# Patient Record
Sex: Male | Born: 1950
Health system: Southern US, Community
[De-identification: ages and names within clinical notes are randomized; demographics above are authoritative.]

## PROBLEM LIST (undated history)

## (undated) DIAGNOSIS — I2510 Atherosclerotic heart disease of native coronary artery without angina pectoris: Secondary | ICD-10-CM

## (undated) DIAGNOSIS — Z789 Other specified health status: Secondary | ICD-10-CM

## (undated) DIAGNOSIS — R37 Sexual dysfunction, unspecified: Secondary | ICD-10-CM

## (undated) DIAGNOSIS — I428 Other cardiomyopathies: Secondary | ICD-10-CM

## (undated) DIAGNOSIS — E785 Hyperlipidemia, unspecified: Secondary | ICD-10-CM

## (undated) DIAGNOSIS — I10 Essential (primary) hypertension: Secondary | ICD-10-CM

## (undated) HISTORY — PX: CORONARY ANGIOPLASTY WITH STENT PLACEMENT: SHX49

## (undated) HISTORY — DX: Other cardiomyopathies: I42.8

## (undated) HISTORY — PX: EXPLORATION POST OPERATIVE OPEN HEART: SHX5061

## (undated) HISTORY — DX: Sexual dysfunction, unspecified: R37

## (undated) HISTORY — DX: Hyperlipidemia, unspecified: E78.5

---

## 1999-11-21 HISTORY — PX: CARDIAC CATHETERIZATION: SHX172

## 2001-02-12 ENCOUNTER — Emergency Department (HOSPITAL_COMMUNITY): Admission: EM | Admit: 2001-02-12 | Discharge: 2001-02-12 | Payer: Self-pay | Admitting: Emergency Medicine

## 2001-02-13 ENCOUNTER — Encounter: Payer: Self-pay | Admitting: Emergency Medicine

## 2001-05-23 ENCOUNTER — Emergency Department (HOSPITAL_COMMUNITY): Admission: EM | Admit: 2001-05-23 | Discharge: 2001-05-23 | Payer: Self-pay | Admitting: Emergency Medicine

## 2001-11-20 DIAGNOSIS — I2510 Atherosclerotic heart disease of native coronary artery without angina pectoris: Secondary | ICD-10-CM | POA: Insufficient documentation

## 2001-11-20 DIAGNOSIS — I351 Nonrheumatic aortic (valve) insufficiency: Secondary | ICD-10-CM

## 2001-11-20 HISTORY — DX: Atherosclerotic heart disease of native coronary artery without angina pectoris: I25.10

## 2001-11-20 HISTORY — DX: Nonrheumatic aortic (valve) insufficiency: I35.1

## 2002-02-11 ENCOUNTER — Encounter: Payer: Self-pay | Admitting: Emergency Medicine

## 2002-02-11 ENCOUNTER — Emergency Department (HOSPITAL_COMMUNITY): Admission: EM | Admit: 2002-02-11 | Discharge: 2002-02-11 | Payer: Self-pay | Admitting: Emergency Medicine

## 2002-02-15 ENCOUNTER — Encounter: Payer: Self-pay | Admitting: *Deleted

## 2002-02-15 ENCOUNTER — Encounter (INDEPENDENT_AMBULATORY_CARE_PROVIDER_SITE_OTHER): Payer: Self-pay | Admitting: *Deleted

## 2002-02-15 ENCOUNTER — Inpatient Hospital Stay (HOSPITAL_COMMUNITY): Admission: EM | Admit: 2002-02-15 | Discharge: 2002-03-01 | Payer: Self-pay | Admitting: *Deleted

## 2002-02-15 ENCOUNTER — Encounter: Payer: Self-pay | Admitting: Emergency Medicine

## 2002-02-17 ENCOUNTER — Encounter: Payer: Self-pay | Admitting: *Deleted

## 2002-02-21 ENCOUNTER — Encounter: Payer: Self-pay | Admitting: Cardiothoracic Surgery

## 2002-02-22 ENCOUNTER — Encounter: Payer: Self-pay | Admitting: Cardiothoracic Surgery

## 2002-02-23 ENCOUNTER — Encounter: Payer: Self-pay | Admitting: Surgery

## 2002-02-24 ENCOUNTER — Encounter: Payer: Self-pay | Admitting: Cardiothoracic Surgery

## 2002-02-25 ENCOUNTER — Encounter: Payer: Self-pay | Admitting: Cardiothoracic Surgery

## 2002-03-27 ENCOUNTER — Ambulatory Visit (HOSPITAL_COMMUNITY): Admission: RE | Admit: 2002-03-27 | Discharge: 2002-03-27 | Payer: Self-pay | Admitting: Cardiovascular Disease

## 2002-03-27 ENCOUNTER — Encounter: Payer: Self-pay | Admitting: Cardiovascular Disease

## 2003-05-29 ENCOUNTER — Inpatient Hospital Stay (HOSPITAL_COMMUNITY): Admission: EM | Admit: 2003-05-29 | Discharge: 2003-06-03 | Payer: Self-pay | Admitting: Emergency Medicine

## 2003-06-18 ENCOUNTER — Ambulatory Visit (HOSPITAL_COMMUNITY): Admission: RE | Admit: 2003-06-18 | Discharge: 2003-06-18 | Payer: Self-pay | Admitting: *Deleted

## 2003-06-18 ENCOUNTER — Encounter: Payer: Self-pay | Admitting: Ophthalmology

## 2003-12-24 ENCOUNTER — Emergency Department (HOSPITAL_COMMUNITY): Admission: EM | Admit: 2003-12-24 | Discharge: 2003-12-24 | Payer: Self-pay | Admitting: Emergency Medicine

## 2004-08-04 ENCOUNTER — Emergency Department (HOSPITAL_COMMUNITY): Admission: EM | Admit: 2004-08-04 | Discharge: 2004-08-04 | Payer: Self-pay | Admitting: *Deleted

## 2004-09-29 ENCOUNTER — Ambulatory Visit (HOSPITAL_COMMUNITY): Admission: RE | Admit: 2004-09-29 | Discharge: 2004-09-29 | Payer: Self-pay | Admitting: Cardiovascular Disease

## 2004-11-20 HISTORY — PX: CIRCUMCISION: SUR203

## 2005-04-14 ENCOUNTER — Emergency Department (HOSPITAL_COMMUNITY): Admission: EM | Admit: 2005-04-14 | Discharge: 2005-04-14 | Payer: Self-pay | Admitting: Emergency Medicine

## 2005-05-26 ENCOUNTER — Inpatient Hospital Stay (HOSPITAL_COMMUNITY): Admission: RE | Admit: 2005-05-26 | Discharge: 2005-06-04 | Payer: Self-pay | Admitting: Urology

## 2005-05-26 ENCOUNTER — Encounter (INDEPENDENT_AMBULATORY_CARE_PROVIDER_SITE_OTHER): Payer: Self-pay | Admitting: Urology

## 2005-06-01 ENCOUNTER — Ambulatory Visit: Payer: Self-pay | Admitting: Orthopedic Surgery

## 2005-06-12 ENCOUNTER — Emergency Department (HOSPITAL_COMMUNITY): Admission: EM | Admit: 2005-06-12 | Discharge: 2005-06-12 | Payer: Self-pay | Admitting: Emergency Medicine

## 2005-06-14 ENCOUNTER — Emergency Department (HOSPITAL_COMMUNITY): Admission: EM | Admit: 2005-06-14 | Discharge: 2005-06-14 | Payer: Self-pay | Admitting: Emergency Medicine

## 2005-06-26 ENCOUNTER — Ambulatory Visit: Payer: Self-pay | Admitting: Orthopedic Surgery

## 2006-07-26 ENCOUNTER — Emergency Department (HOSPITAL_COMMUNITY): Admission: EM | Admit: 2006-07-26 | Discharge: 2006-07-26 | Payer: Self-pay | Admitting: Emergency Medicine

## 2009-07-04 ENCOUNTER — Emergency Department (HOSPITAL_COMMUNITY): Admission: EM | Admit: 2009-07-04 | Discharge: 2009-07-04 | Payer: Self-pay | Admitting: Emergency Medicine

## 2009-07-08 ENCOUNTER — Ambulatory Visit (HOSPITAL_COMMUNITY): Admission: RE | Admit: 2009-07-08 | Discharge: 2009-07-08 | Payer: Self-pay | Admitting: Emergency Medicine

## 2011-02-25 LAB — URINALYSIS, ROUTINE W REFLEX MICROSCOPIC
Ketones, ur: NEGATIVE mg/dL
Leukocytes, UA: NEGATIVE
Protein, ur: NEGATIVE mg/dL

## 2011-02-25 LAB — DIFFERENTIAL
Basophils Absolute: 0 10*3/uL (ref 0.0–0.1)
Eosinophils Relative: 4 % (ref 0–5)
Lymphocytes Relative: 20 % (ref 12–46)
Monocytes Absolute: 0.7 10*3/uL (ref 0.1–1.0)
Neutrophils Relative %: 65 % (ref 43–77)

## 2011-02-25 LAB — CBC
HCT: 37.5 % — ABNORMAL LOW (ref 39.0–52.0)
MCV: 74.8 fL — ABNORMAL LOW (ref 78.0–100.0)
WBC: 6.1 10*3/uL (ref 4.0–10.5)

## 2011-02-25 LAB — BASIC METABOLIC PANEL
BUN: 15 mg/dL (ref 6–23)
CO2: 22 mEq/L (ref 19–32)
GFR calc non Af Amer: >60 mL/min (ref >60)
Sodium: 135 mEq/L (ref 135–145)

## 2011-02-25 LAB — URINE MICROSCOPIC-ADD ON

## 2011-02-25 LAB — POCT CARDIAC MARKERS
CKMB, poc: 1.3 ng/mL (ref 1.0–8.0)
Myoglobin, poc: 77.1 ng/mL (ref 12–200)
Troponin i, poc: <0.05 ng/mL (ref 0.00–0.09)

## 2011-02-25 LAB — BRAIN NATRIURETIC PEPTIDE: Pro B Natriuretic peptide (BNP): <30 pg/mL (ref 0.0–100.0)

## 2011-02-25 LAB — PROTIME-INR: INR: 2.7 — ABNORMAL HIGH (ref 0.00–1.49)

## 2011-04-07 NOTE — Procedures (Signed)
[facility]  Patient:    [name], [name] Visit Number: [id number] MRN: [id number]          Service Type: EMS Location: ED Attending Physician:  Rosalyn Charters Dictated by:   Kari Baars, M.D. Proc. Date: 02/15/02 Admit Date:  02/15/2002 Discharge Date: 02/15/2002                            EKG Interpretations  The rhythm is a sinus with a rate in the 70s.  The is left ventricular hypertrophy.  There is possible intraventricular conduction delay and this may be related to the LVH as well.  There is ST elevation anteriorly which could be related to LVH.  Abnormal electrocardiogram. Dictated by:   Kari Baars, M.D. Attending Physician:  Rosalyn Charters DD:  02/19/02 TD:  02/20/02 Job: [id number]

## 2011-04-07 NOTE — Op Note (Signed)
[place]. [facility] 

## 2011-04-07 NOTE — Discharge Summary (Signed)
[place]  Patient:    [name], [name] Visit Number: [id number] MRN: [id number]          Service Type: MED Location: 2000 2040 01 Attending Physician:  Waldo Laine Dictated by:   Maxwell Marion, RNFA Admit Date:  02/15/2002 Disc. Date: 03/01/02   CC:         [name], M.D.  [facility] Department   Discharge Summary  DATE OF BIRTH:  Dec 24, 1950  ADMITTING DIAGNOSES: 1. Acute congestive heart failure. 2. Valvular dysfunction. 3. Uncontrolled hypertension.  PAST MEDICAL HISTORY: 1. Aortic valve insufficiency, evaluated by Dr. Alanda [name] in February 2002,    with a 2D echocardiogram. 2. History of syphilis treated seven years ago and again three weeks ago with    IM penicillin.  ALLERGIES:  No known drug allergies.  DISCHARGE DIAGNOSIS:  Aortic dilatation, status post aortic valve and aortic root [name].  HISTORY OF PRESENT ILLNESS:  [name] is a 60 year old, African-American male.  On March 24, he experienced sudden onset of band-like abdominal pain accompanied by shortness of breath.  He was evaluated at the hospital on March 25, and given the medication for acid reflux.  On March 28, he was awakened at 3 a.m. with severe shortness of breath.  This progressively worsened.  He eventually went to the emergency room at [facility].  He experienced chest tightness and soreness with this episode.  EKG revealed a left ventricular hypertrophy with ST abnormalities.  Chest x-ray revealed massive CM and BNP was positive.  It was recommended that he be transferred to [facility] for further evaluation and treatment.  HOSPITAL COURSE:  On March 29, [name] was admitted to [facility] in the care of Dr. Domingo Sep and the [facility] and Vascular Center. After evaluation, Dr. Domingo Sep recommended CT scan of the chest.  This revealed a thoracic aortic aneurysm measuring  approximately 5 cm and LV hypertrophy.  He underwent cardiac catheterization on March 31, which revealed dilated aortic root, ejection fraction 25%, 3-4+ aortic valve insufficiency. No coronary artery disease was detected.  Doppler studies done that day revealed no significant carotid artery disease.  His ABIs were noted to be greater than 1.0 bilaterally.  Also on March 31, cardiac surgery consult was obtained with Dr. Sheliah Plane.  After examination of the patient and review of the records and CT scans, Dr. Ysidro Evert recommendation was that [name].  He did recommend dental consult prior to surgery.  On April 1, Dr. Kristin Bruins evaluated [name] and recommend tooth extraction and alveoplasty.  This was completed on April 2, with no complications and the patient tolerated it very well.  On April 4, [name] underwent a Bentall procedure (aortic valve [name] and aortic root [name]) with a 27 mm St. Jude valve and conduit prosthesis.  He tolerated this procedure well and was transferred in stable condition to the SICU.  Also on April 4, ID consult was obtained regarding positive RPR titer.  They recommended evaluation of the surgical specimen for syphilitic aortitis also to initiate treatment with penicillin 2.4 IM each week x3 doses.  Also recommended was further STD testing.  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  The remainder of his STD workup was negative.  [name] has remained hemodynamically stable throughout the postoperative period.  He did have some mild elevation in his  creatinine measuring 1.5 on April 7.  Coumadin anticoagulant therapy was initiated on April 7.  His PT/INR did not immediate rise so Lovenox was also initiated on April 9. Postoperatively, he did experience anemia requiring transfusion on April 9. On that day, his hemoglobin was 7.4,  hematocrit 21.9.  After transfusion, his H&H improved to 8.1 and 24.1 on April 10.  On April 8, cardiology consult was obtained regarding an episode of wide complex tachycardia.  Electrophysiology consult was done by Dr. Ladona Ridgel.  After examination of the patient and reviewing all his records, he recommended medical treatment with an ACE inhibitor and beta-blocker.  [name] is making very good progress after his surgery.  On the morning of April 12, he is afebrile.  His laboratory studies are stable.  His Coumadin is therapeutic at 2.1.  His wounds are healing well and he has tolerated a regular diet and was ambulating well.  He did have a short run of SVT early in the morning and he was asymptomatic.  He was observed for several more hours without recurrence and was able to be discharged home with instructions to call his cardiologist if this recurs and is persistent.  LABORATORY DATA AND X-RAY FINDINGS:  On April 12, white blood cells 13.7, hemoglobin 8.8, hematocrit 26.1, platelets 543.  On April 11, chemistries were sodium 134, potassium 3.8, chloride 100, CO2 27, BUN 28, creatinine 1.2, glucose 111.  On April 12, PT 20.5, INR 2.1.  Digoxin level less than 0.1.  CONDITION ON DISCHARGE:  Improved.  ACTIVITY:  He has been instructed to do no driving and no lifting, pushing or pulling of anything more than 10 pounds.  He has also been instructed to continue his breathing exercises and daily walks.  DIET:  Low fat, low salt diet.  WOUND CARE:  He may shower at home.  He has been instructed to clean his wounds with soap and water and call the office if wounds or problems arise as described in the cardiac surgery fact sheet.  DISCHARGE MEDICATIONS: 1. Tylox one to two p.o. q.4-6h. p.r.n. pain. 2. Coreg 3.125 mg p.o. q.12h. 3. Altace 2.5 mg p.o. q.d. 4. Lanoxin 0.125 mg p.o. q.d. 5. Coumadin 7.5 mg p.o. q.d. or as instructed. 6. Niferex 150 mg p.o. q.d.  7. Folic acid 1 mg p.o.  q.d.  FOLLOWUP:  He has been instructed to have his blood drawn at [facility] on Monday, April 14, for a PT/INR level.  The results will be faxed to the [facility] and [facility] Coumadin Clinic and the patient will be contacted regarding his Coumadin dose.  He has been instructed to follow up with the [facility] Department next Friday, April 18, for his last penicillin injection.  This injection will complete his full course of treatment for syphilis.  He has been instructed to call Dr. Mancel Parsons office and arrange an appointment in approximately two weeks. He has an appointment at the CVTS office to see Dr. Tyrone Sage on Thursday, May 1, at 10:40 a.m.  He has been asked to get a chest x-ray at the [facility] at 9:40 a.m. that morning. Dictated by:   Maxwell Marion, RNFA Attending Physician:  Waldo Laine DD:  03/01/02 TD:  03/03/02 Job: [phone] [id number]

## 2011-04-07 NOTE — Discharge Summary (Signed)
NAME:  [name], [name]                           ACCOUNT NO.:  [id number]   MEDICAL RECORD NO.:  [id number]                   PATIENT TYPE:  INP   LOCATION:  4728                                 FACILITY:  [facility]   PHYSICIAN:  [name] A. Alanda Amass, M.D.          DATE OF BIRTH:  [birth year]   DATE OF ADMISSION:  05/29/2003  DATE OF DISCHARGE:  06/03/2003                                 DISCHARGE SUMMARY   DISCHARGE DIAGNOSES:  1. Nontherapeutic INR with mechanical aortic valve replacement.  2. Hypertension.  3. Gastroesophageal reflux disease.  4. Long-term anticoagulation.   CONDITION ON DISCHARGE:  Improved.   DISCHARGE MEDICATIONS:  1. Coreg 12.5 mg 1 twice a day.  2. Altace 10 mg daily.  3. Lanoxin 0.125 mg daily.  4. Folic acid 1 mg daily.  5. Triamterene/HCTZ 37.5/25 daily.  6. Coumadin 10 mg daily except 12.5 Tuesday and Thursday, 2 tablets of 5 mg     daily except 2-1/2 tablets on Tuesday and Thursday.   DISCHARGE INSTRUCTIONS:  1. Activity as tolerated.  2. A 2 g sodium diet.  3. Have blood work done on Friday of this week.  4. Follow up with Dr. Alanda Amass as previously instructed in three months.   HISTORY OF PRESENT ILLNESS:  A 60 year old African-American male with a  history of reconstruction of aortic root and repair of ascending aorta with  a Bentall #25 St. Jude aortic valve conduit on chronic Coumadin, with a  longstanding history of noncompliance of Coumadin and pro time evaluations.   His prior pro time was on May 01, 2003, and he was 1.7.  He was instructed  to recheck frequently.  No recheck until May 29, 2003, and his INR was 1.2.  The patient was called and asked to come to ER for medication with IV  heparin, Coumadin reload.  He stated he went to [place] and ran out of  Coumadin for the three days prior to admission.   PAST MEDICAL HISTORY:  1. Aortic valve replacement with Bentall reconstruction April 2003 secondary     to severe AI and aortic  root aneurysm.  2. Chronic anticoagulation on Coumadin.  3. Hypertension.  4. LV dysfunction, EF 40 to 45%.  5. Nonischemic cardiomyopathy.  6. History of RPR.  7. Gastroesophageal reflux disease.   OUTPATIENT MEDICATIONS:  1. Coreg 12.5 twice a day.  2. Altace 10 daily.  3. Digitek 125 mcg daily.  4. Coumadin 7.5 mg daily, but he has been subtherapeutic for one year.  5. Folic acid 1 mg daily.  6. Triamterene/HCTZ 37.5/25 daily.   ALLERGIES:  VICODIN.   For Family History, Social History, Review of Systems, see H&P.   PHYSICAL EXAMINATION AT DISCHARGE:  VITAL SIGNS:  Blood pressure 146/88,  pulse 62, respirations 20, temperature 98.  Room air oxygen saturation 100%.  Sinus rhythm.  LUNGS:  Clear to auscultation bilaterally.  HEART:  Regular  rate and rhythm.   LABORATORY DATA:  1. Hemoglobin on admission 12.1, hematocrit 36.3, WBC 7.4, platelets 207.     This remained stable.  MCV slightly low at 76.5.  2. Pro time 14.6, INR 1.2 on heparin and Coumadin extra load.  Prior to     discharge, he was 2.2 INR and pro time 20.3.  3. Sodium 138, potassium 3.6, chloride 106, CO2 26, glucose 118, BUN 20,     creatinine 1.1, calcium 9.9. Digoxin 0.2.  4. EKG on admission: Sinus rhythm, left ventricular hypertrophy with QRS     finding and repolarization abnormality.  Rate slower than on previous     tracing.   I did not have a chest x-ray report in the chart.   HOSPITAL COURSE:  Mr. Knoedler was admitted by Dr. Alanda Amass on May 29, 2003,  for inadequate, nontherapeutic INRs.  The patient actually stopped his  Coumadin three days prior to admission because he ran out.  He has  prosthetic valve conduit.   He was placed on heparin, and Coumadin was restarted.  The patient remained  in the hospital.  He was stable except INR was not therapeutic, and by June 03, 2003, his INR was 2.2 and was sent home with higher dose of Coumadin.  Will have it rechecked on Friday.           Darcella Gasman. [place], N.P.                     [name] A. Alanda Amass, M.D.   LRI/MEDQ  D:  07/07/2003  T:  07/08/2003  Job:  [id number]   cc:   [facility] Department

## 2011-04-07 NOTE — Consult Note (Signed)
NAME:  [name] [name]               ACCOUNT NO.:  [id number]   MEDICAL RECORD NO.:  [id number]          PATIENT TYPE:  INP   LOCATION:  A311                          FACILITY:  [facility]   PHYSICIAN:  Lonia Blood, M.D.      DATE OF BIRTH:  1951-11-02   DATE OF CONSULTATION:  05/26/2005  DATE OF DISCHARGE:                                   CONSULTATION   REASON FOR CONSULTATION:  Chronic anticoagulation.   CONSULT REQUESTED FOR:  Dr. Dennie Maizes, urology.   HISTORY OF PRESENT ILLNESS:  The patient is a 60 year old African-American  man with history of aortic valve replacement with St. Jude's valve, who has  been on chronic anticoagulation.  The patient was being followed by Dr.  Sheffield Slider of [facility].  He has had recent  recurrent balanitis and phimosis that necessitated surgery.  He had  circumcision done today.  Prior to his surgery, the patient's Coumadin was  held to raise his INR.  His INR has since dropped to 1.2.  His INR was  supposed to be in the range between 2.5 and 3.5.  Now postoperatively, he  will need to have his anticoagulation resumed.  This necessitated this  consult.   PAST MEDICAL HISTORY:  Significant for mainly hypertension.  Also aortic  valve disease, status post aortic valve replacement.  He has also had some  LVH from his disease with secondary ST-T wave changes, but that is chronic,  with some poor R wave progression.  The patient also has a history of GERD.   ALLERGIES:  No known drug allergies.   SOCIAL HISTORY:  The patient lives in [place].  He lives alone.  Apparently he is single.  Denied any tobacco or alcohol use.   FAMILY HISTORY:  Significant for history of diabetes and heart disease on  both sides of his family.   CURRENT MEDICATIONS:  1.  Folic acid 1 mg daily.  2.  Nexium 40 mg daily.  3.  Norvasc 5 mg daily.  4.  Coreg 25 mg p.o. b.i.d.  5.  Coumadin 5 mg daily.  6.  Altace 10 mg p.o. daily.  7.   Digoxin 0.125 mg daily.  8.  Triamterene/hydrochlorothiazide 37.5/25 one p.o. Monday, Wednesday and      Friday.   REVIEW OF SYSTEMS:  Mainly pain at the site of surgery, but otherwise 10  point review of systems essentially negative.   PHYSICAL EXAMINATION:  VITAL SIGNS:  Temperature 97.6, blood pressure  113/89, pulse 56, respiratory rate 20.  Saturations 96% on 2 liters.  GENERAL:  The patient is awake, alert, oriented, slightly drowsy  postoperatively, but he is in no acute distress.  HEENT:  PERRL/EOMI.  NECK:  Supple.  No JVD, no lymphadenopathy.  RESPIRATORY:  He has good air entry bilaterally.  No wheezes or rhonchi.  CARDIOVASCULAR:  The patient has regular rate and rhythm with permanent  systolic click.  ABDOMEN:  Obese, nontender, with positive bowel sounds.  EXTREMITIES:  Show no edema, cyanosis or clubbing.   LABORATORY DATA:  Labs today showed  his PT-INR to be 15.3 and 1.2,  respectively.  His PTT was 34.   ASSESSMENT:  This is a 60 year old gentleman admitted with phimosis,  balanitis, status post circumcision.  The patient has St. Jude's valve that  requires chronic anticoagulation.  Target INR should be between 2.5 and 3.5.  He is currently subtherapeutic with his INR.   PLAN:  1.  Anticoagulation.  Will recommend starting heparin gtt,  right now with      no bolus.  Will also started Coumadin later tonight and continue to      monitor his PT-INR.  Once his INR is therapeutic in the range of 2.5 to      3.5, the heparin can be discontinued.  2.  Hypertension.  Recommend continue with his home medications for his      blood pressure control.  3.  Of note, I have discussed with Dr. Allyson Sabal who is on-call for Dr.      Linford Arnold  today.  Dr. Allyson Sabal agrees with this plan and is okay with [place]      continuing with patient's care as indicated here.  I will therefore      continue by asking pharmacy to start heparin right away and then the      Coumadin  thereafter.       LG/MEDQ  D:  05/26/2005  T:  05/26/2005  Job:  [id number]

## 2011-04-07 NOTE — Consult Note (Signed)
[place]  Patient:    [name], [name] Visit Number: [id number] MRN: [id number]          Service Type: MED Location: 2000 2040 01 Attending Physician:  Waldo Laine Dictated by:   Doylene Canning. Ladona Ridgel, M.D. [facility] Proc. Date: 02/26/02 Admit Date:  02/15/2002   CC:         Lennette Bihari, M.D.  [name] A. Alanda Amass, M.D.  Kathrine Cords  [facility]   Consultation Report  ELECTROPHYSIOLOGIC STUDY  INDICATION FOR CONSULTATION:  Evaluation of wide QRS tachycardia in the setting of aortic regurgitation, nonischemic cardiomyopathy status post aortic valve and ascending aortic replacement.  HISTORY OF PRESENT ILLNESS:  The patient is a very pleasant 60 year old man with a long history of a "murmur" who is known to have aortic insufficiency over a year ago. Several weeks ago he developed congestive heart failure symptoms and subsequently presented and was found to have severe LV systolic dysfunction and severe aortic insufficiency. He had no obstructive coronary disease. His ejection fraction was approximately 20%. The patient subsequently underwent aortic valve replacement with ascending aorta replacement with a Dacron patch. His postoperative course has been notable for some congestive heart failure as well as a 25 beat run of nonsustained ventricular tachycardia at a cycle length of 380 msec. This was associated with shortness of breath and lightheadedness but no frank syncope. The tachycardia terminated spontaneously. The patient denies any history of syncope or near syncope. He denies any family history of sudden cardiac death. There is no history of Marfan syndrome.  PAST MEDICAL HISTORY:  Notable for history of congestive heart failure. He also has a history of gastroesophageal reflux disease, he has a history of untreated hypertension which most likely is the etiology behind his aortic insufficiency, and he has a history of  syphilis status post multiple treatments.  CURRENT MEDICATIONS:  1. Coreg 3.125 mg twice a day.  2. Altace 2.5 mg a day.  3. Digoxin 0.125 mg a day.  4. Folate 1 mg a day.  5. Penicillin.  6. Pepcid 20 mg a day.  7. Aspirin 81 mg a day.  8. Coumadin.  9. Lasix 40 mg a day. 10. Lovenox.  SOCIAL HISTORY:  The patient lives in [place] with his girlfriend. He works at [employer]. He denies tobacco or ethanol abuse.  FAMILY HISTORY:  Notable for a mother who is alive at age 67 with diabetes and heart disease. He has a father who is also alive at age 68. He has seven brothers and six sisters all alive and well.  REVIEW OF SYSTEMS:  Notable for no weight change or adenopathy. He does have some fevers and chills. He denies headache, vision or hearing changes. He denies skin problems. He denies chest pain or peripheral edema. He does note shortness of breath and dyspnea. He denies syncope or claudication or chronic cough. He denies arthralgias, joint swelling, or deformities. He denies dysuria or hematuria or nocturia. He denies weakness or numbness. He denies nausea or vomiting, diarrhea or constipation. He denies polyuria or polydipsia.  PHYSICAL EXAMINATION:  GENERAL:  He is a pleasant middle aged man who is dyspneic but in no respiratory distress.  VITAL SIGNS:  Blood pressure 120/70, the pulse was 82 and regular, the respirations were 20, temperature was 97.  HEENT:  Normocephalic and atraumatic. The pupils equal and round. Oropharynx was moist.  NECK:  No bruits or thyromegaly. His JVD was approximately 8-9 cm. The carotid  upstrokes were 2+ and symmetric.  CARDIOVASCULAR:  Regular rate and rhythm with normal S1 and a mechanical aortic valve closure sound. There was a very short, soft systolic murmur. The PMI was laterally displaced.  LUNGS:  Bibasilar rales. They were otherwise clear.  ABDOMEN:  Soft, nontender, nondistended. There was no  organomegaly.  EXTREMITIES:  Demonstrated no cyanosis or clubbing. He did have 1+ lower extremity peripheral edema, worse on the right than the left. There were no rashes or lesions.  MUSCULOSKELETAL:  Demonstrates no joint deformities or effusions or scoliosis.  NEUROLOGIC:  Alert and oriented x3. His cranial nerves II-XII were intact.  IMPRESSION: 1. Aortic insufficiency with aortic root dilatation. 2. Status post aortic valve replacement with ascending aorta replacement. 3. Nonsustained ventricular tachycardia. 4. Nonischemic cardiomyopathy with ejection fraction of 20%.  DISCUSSION:  The patient does not have ischemic heart disease. Despite his nonsustained VT there is no indication for ICD implantation or EP testing in this group of patients. I would recommend continued aggressive medical therapy of his nonischemic cardiomyopathy. This would, of course, include beta blocker therapy. Should he have sustained monomorphic VT then ICD implantation would be a consideration. Dictated by:   Doylene Canning. Ladona Ridgel, M.D. [facility] Attending Physician:  Waldo Laine DD:  02/26/02 TD:  02/27/02 Job: [id number]

## 2011-04-07 NOTE — Op Note (Signed)
NAME:  [name], [name]               ACCOUNT NO.:  [id number]   MEDICAL RECORD NO.:  [id number]          PATIENT TYPE:  AMB   LOCATION:  DAY                           FACILITY:  [facility]   PHYSICIAN:  Dennie Maizes, M.D.   DATE OF BIRTH:  [birth year]   DATE OF PROCEDURE:  05/26/2005  DATE OF DISCHARGE:                                 OPERATIVE REPORT   PREOPERATIVE DIAGNOSES:  Recurrent balanitis, phimosis.   POSTOPERATIVE DIAGNOSES:  Recurrent balanitis, phimosis.   OPERATIVE PROCEDURE:  Circumcision.   ANESTHESIA:  Spinal.   SURGEON:  Dennie Maizes, M.D.   COMPLICATIONS:  None.   ESTIMATED BLOOD LOSS:  Minimal.   INDICATIONS FOR PROCEDURE:  This 60 year old male with recurrent balanitis  and phimosis was taken to the OR today for circumcision under anesthesia.   DESCRIPTION OF PROCEDURE:  Spinal anesthesia was induced, and the patient  was placed on the OR table in the supine position. The lower abdomen and  genitalia were prepped and draped in a sterile fashion. Examination revealed  recurrent balanitis and phimosis. The foreskin could not be retracted. The  foreskin was then clamped at the 6 and 12 o'clock positions with straight  hemostats. Dorsal and ventral slits were made. The redundant foreskin was  excised. Hemostasis was obtained by cauterization. The edges of the foreskin  were then approximated using 4-0 chromic gut. A Vaseline gauze dressing and  Coban were applied to the penis. The sponges and instruments were correct x2  at the time of closure. The patient was transferred to the PACU in a  satisfactory condition.       SK/MEDQ  D:  05/26/2005  T:  05/26/2005  Job:  [id number]

## 2011-04-07 NOTE — Consult Note (Signed)
NAMEHA, PLACERES               ACCOUNT NO.:  [id number]   MEDICAL RECORD NO.:  [id number]          PATIENT TYPE:  INP   LOCATION:  A311                          FACILITY:  [facility]   PHYSICIAN:  Vickki Hearing, M.D.DATE OF BIRTH:  Nov 07, 1951   DATE OF CONSULTATION:  06/01/2005  DATE OF DISCHARGE:                                   CONSULTATION   REASON FOR CONSULTATION:  Left thigh pain and numbness.   HISTORY AND PHYSICAL:  As dictated and recorded in the medical record by Dr.  Rito Ehrlich and Orvan Falconer relating to the patient's left thigh pain, the patient  reports a 2-3 day history of left thigh numbness and pain starting in his  lumbar area across his left buttock into his left thigh, but not below the  knee, denies any weakness, he has no urinary retention or loss of bowel  control.  He has had no previous history of this type of discomfort.  He  describes the pain as severe, numb-like feeling and quite disabling and is  effecting him when he gets out of bed, and it is hard for him to get going.   ORTHOPEDIC EXAMINATION:  He was a well-developed, well-nourished male,  grooming and hygiene were adequate.  His vital signs were stable and as  recorded in the medical record.  His cardiovascular exam was normal.  His  skin was intact.  He had some previous surgical incisions around his body.  He had normal neurologic function in terms of reflexes and sensation.  He  had normal movement in his motor function in his lower extremities and upper  extremities.  He had no lymphadenopathy.  He was awake, alert and oriented  x3 with normal mood.   He has had a CT scan done which showed mild facet arthritis at L5-S1, no  compressive lesions or spinal stenosis.   IMPRESSION:  Left thigh pain cause unknown.   PLAN:  There appears to be no spine-related reason for his left thigh pain.  I would suggest reexamination in terms of his surgery to see if there is any  related findings related to  the surgery and if not, then treat him with  analgesics until this resolves.       SEH/MEDQ  D:  06/02/2005  T:  06/02/2005  Job:  [id number]

## 2011-04-07 NOTE — H&P (Signed)
NAME:  [name], [name]               ACCOUNT NO.:  [id number]   MEDICAL RECORD NO.:  [id number]          PATIENT TYPE:  AMB   LOCATION:  DAY                           FACILITY:  [facility]   PHYSICIAN:  Dennie Maizes, M.D.   DATE OF BIRTH:  Sep 14, 1951   DATE OF ADMISSION:  05/25/2005  DATE OF DISCHARGE:  LH                                HISTORY & PHYSICAL   CHIEF COMPLAINT:  Recurrent inflammation of the foreskin.   HISTORY OF PRESENT ILLNESS:  This 60 year old male has been referred to me  from the emergency room at [facility].  He has been having  recurrent inflammation of the foreskin for the past one month.  He has  noticed scarring of the foreskin.  He is unable to retract the foreskin.  He  has tearing of the foreskin after sexual intercourse which is very painful.  He has persistent problems.  After office evaluation, he will be brought to  the short stay center for circumcision under anesthesia.   PAST MEDICAL HISTORY:  History of hypertension.   MEDICATIONS:  1.  Folic acid 1 mg one p.o. daily.  2.  Nexium 40 mg one p.o. daily.  3.  Norvasc 5 mg one p.o. daily.  4.  Coreg 25  mg p.o. b.i.d.  5.  Coumadin 5 mg one p.o. daily.  6.  Altace 10 mg one p.o. daily.  7.  Digoxin 0.125 mg one p.o. daily.  8.  Triamterene hydrochlorothiazide 37.5/25 one p.o. on Mondays, Wednesdays,      and Fridays.   FAMILY HISTORY:  Positive for diabetes mellitus and heart disease.   PHYSICAL EXAMINATION:  VITAL SIGNS:  Height 6 feet, 1 inch, weight 272  pounds.  HEENT:  Normal.  NECK:  No masses.  LUNGS:  Clear to auscultation.  HEART:  Regular rate and rhythm.  Status post aortic valve replacement with  mechanical valve  ABDOMEN:  Soft.  No palpable frank mass, CVA tenderness, or bladder  distention.  GU:  Penis with recurrent balanitis with phimosis.  Testes are normal.  RECTAL:  Benign.  Prostate about 40 gm in size.   IMPRESSION:  Recurrent balanitis, phimosis, status post  aortic valve  replacement.   PLAN:  1.  I have discussed with Dr. Alanda Amass about stopping the Coumadin.  The      patient has been seen by Dr. Alanda Amass, and the Coumadin has been      stopped.  When the PT and PTT become normal, circumcision will be done      under anesthesia at [facility].  The patient will be admitted      to the hospital for postoperative heparin therapy.  He may need several      days of hospitalization.  2.  I have discussed with the patient regarding the diagnosis, operative      details, alternate treatments, outcome, possible risks and      complications, and he has agreed for the procedure to be done.  He has      an increased risk for bleeding, as  well as thromboembolic phenomenon.  I      have discussed with the patient about the risks involved.       SK/MEDQ  D:  05/25/2005  T:  05/25/2005  Job:  [id number]   cc:   Gerlene Burdock A. Alanda Amass, M.D.  [phone] [address]  [place]  [place] [address]  Fax: [phone]

## 2011-04-07 NOTE — Op Note (Signed)
[place]  Patient:    GUSTABO, GORDILLO Visit Number: [id number] MRN: [id number]          Service Type: MED Location: 2000 2040 01 Attending Physician:  Waldo Laine Dictated by:   Gwenith Daily Tyrone Sage, M.D. Proc. Date: 02/21/02 Admit Date:  02/15/2002   CC:         [name] A. Alanda Amass, M.D. in [place] office   Operative Report  PREOPERATIVE DIAGNOSIS:  Severe aortic insufficiency with dilated aortic root in the ascending aorta.  POSTOPERATIVE DIAGNOSIS:  Severe aortic insufficiency with dilated aortic root in the ascending aorta.  SURGICAL PROCEDURE:  Aortic valve replacement, reconstruction of aortic root, and replacement of ascending aorta/Bentall procedure with a #25 St. Jude aortic valve conduit.  SURGEON:  Gwenith Daily. Tyrone Sage, M.D.  FIRST ASSISTANT:  Adair Patter, P.A.  BRIEF HISTORY:  The patient is a 60 year old male who approximately one year ago presented with evidence of aortic insufficiency.  The patient was returned this admission with symptoms of severe congestive heart failure and was admitted to [facility] and underwent further evaluation including cardiac catheterization by Dr. Daphene Jaeger.  Catheterization revealed minimal luminal irregularities in his coronary arteries, marked LV dilatation, severe aortic insufficiency, and dilated ascending aorta and aortic root.  A dental consult was obtained and the patients dental situation was stabilized as was his symptoms of congestive heart failure.  Because of his severe aortic insufficiency and dilated aortic root, aortic valve and ascending aorta replacement was recommended to the patient who agreed and signed informed consent.  The use of a mechanical valve conduit was discussed with the patient and the need for lifelong anticoagulation.  The patient was agreeable with this and had no direct contraindications to the use of Coumadin.  DESCRIPTION OF PROCEDURE:   With Swan-Ganz and arterial line monitors in place, the patient underwent general endotracheal anesthesia without incident.  The skin of the chest and legs were prepped with Betadine and draped in the usual sterile manner.  A transesophageal echo probe was placed and findings are dictated under a separate note, but this confirmed significant LV dilatation, high grade 3-4+ aortic insufficiency with dilated aortic root.  Median sternotomy was performed.  Pericardium was opened.  As noted on preoperative studies, the patient had marked dilatation of the aortic root.  In addition, the pulmonary artery was noted to be markedly enlarged.  The patient had severe biventricular dilatation.  He was systemically heparinized.  The ascending aorta was cannulated.  The right atrium was cannulated.  The patient was placed on cardiopulmonary bypass at 2.4 L/minute/sq m.  The right superior pulmonary vein vent was placed.  A retrograde cardioplegia catheter was placed into the coronary sinus through a separate stab in the right atrium.  The patients body temperature was cooled to 28 degrees.  At the site of the cannulation in the aorta and the proximal very distal ascending arch was relatively normal in size.  The aortic cross-clamp was placed in the distal ascending aorta.  The aorta was opened and additional cold blood cardioplegia was administered down directly into each coronary ostia.  The leaflet edges were significantly rolled, distorted, and markedly elongated.  The valve leaflets were excised.  The aorta was excised, preserving two buttons, one around a very large left main coronary artery and one around a smaller right coronary artery, up to the point of the distal ascending aorta from where the distal anastomosis was ________.  The annulus was sized  to a 25 St. Jude valve conduit.  Number two Tycron pledgeted sutures with the pledgets on the exterior surface of the annulus were placed in a  circumferential manner.  The 25 St. Jude valve conduit was then secured into place in the aortic annulus.  With this secured in place, BioGlue was placed along the suture line. Position of the left coronary button was then carried out and using a temperature cautery, the graft was excised to accommodate the left main ostia. The left main ostium was then sewed with a running 4-0 Prolene suture.  The distal graft was then trimmed to the appropriate length and the distal anastomosis with the ascending aorta was carried out with a running 3-0 Prolene.  The graft was allowed to fill temporarily and pressurize to position the proper site for the right coronary button anastomosis.  In a similar fashion, the graft was excised with the high temperature cautery, and the right coronary button was sewed to the ascending aortic graft with a running 4-0 Prolene.  Prior to complete closure of this anastomosis, the head was put in a down position.  The heart and ascending aorta were allowed to passively fill and deairing of the anastomosis was completed.  The aortic cross-clamp was removed with a total cross-clamp time of 137 minutes.  The patient spontaneously converted to a sinus rhythm.  A 16-gauge needle was introduced in the left ventricular apex to further deair the heart.  The patients body temperature was rewarmed to 37 degrees.  He was then ventilated and weaned from cardiopulmonary bypass without difficulty and remained hemodynamically stable on milrinone and dopamine infusion.  Protamine sulfate was administered with the operative field hemostatic.  Two atrial and two ventricular pacing wires were applied.  The pericardium was loosely reapproximated.  Two mediastinal tubes were left in place.  The sternum was closed with #6 stainless steel wire.  The fascia was closed with interrupted 0 Vicryl and running 3-0 Vicryl in the subcutaneous tissue, 4-0 subcuticular stitch in the skin edges.  Dry  dressings were applied.  The sponge and needle count was reported as correct at the completion of the  procedure.  Total pump time was 185 minutes. Dictated by:   Gwenith Daily Tyrone Sage, M.D. Attending Physician:  Waldo Laine DD:  03/01/02 TD:  03/02/02 Job: [id number]

## 2011-04-07 NOTE — Cardiovascular Report (Signed)
[facility]  Patient:    [name], [name] Visit Number: [id number] MRN: [id number]          Service Type: EMS Location: ED Attending Physician:  Rosalyn Charters Dictated by:   Lennette Bihari, M.D. Proc. Date: 02/17/02 Admit Date:  02/15/2002 Discharge Date: 02/15/2002   CC:         [name] A. Alanda Amass, M.D.  Primary Care Clinic, [place], [place]   Cardiac Catheterization  INDICATIONS:  [name] is a 60 year old black gentleman who apparently had seen Dr. Alanda Amass.  At that time, the patient had aortic root dilatation of 4.8 cm with moderate to severe aortic insufficiency, and moderate left ventricular hypertrophy apparently, approximately 14 months ago. Apparently, Dr. Alanda Amass started him on vasodilator therapy and close followup was recommended.  End systolic dimension at that time was 54 mm. Apparently, the patient never did have a followup.  He did stop his medications.  He was admitted to [facility] by Dr. Ermalinda Memos this weekend with progressive shortness of breath over the last several weeks.  He also was found to have significant cardiac murmur compatible with aortic regurgitation.  An echo done by Dr. Ermalinda Memos showed severe LVEF, and an LV function with an EF of less than 20 percent.  He also was felt to have dilated left atrium by CT of 5-6 cm.  He is now referred for right and left heart catheterization.  PROCEDURE:  Technically this was a difficult procedure due to the marked aortic root dilatation and the need for multiple catheters specifically in attempt to cannulate his coronary vesicles.  The procedure done via the right femoral artery and right femoral vein.  A Swan-Ganz catheter was inserted and advanced under hemodynamic monitoring to the RA, RV, PA, and PC positions. Cardiac outputs were obtained by the thermodilution method and also by the assumed FICK method.  O2 saturation was obtained in the central  aorta as well as the pulmonary artery for FICK determination.  Simultaneous PA and AO pressures were recorded after the pigtail catheter was advanced through the central aorta.  The pigtail catheter was then advanced into the LV.  Biplane cine left ventriculography was performed.  Because of the patients severe aortic regurgitation, opacification was not optimal.  Essential aortography was then performed with 40 cc of contrast at a rate of 20.  Distal aortography was also performed.  Multiple attempts were made at cannulating the coronary arteries.  Initially, 6-French JL-4, 6-French JL-5, and 6-French JL-6 catheters were used with still inability to cannulate the left main. Ultimately, this was successful with an AL-3 catheter and multiple views were obtained.  The right coronary artery was also very difficult to cannulate. Numerous catheters were used including a JR-4, JR-5, JR-6, and AR-2.  Attempt was made with the Amplatz left catheter, and ultimately opacification was done of the vessel was able to be seen with a right bypass catheter.  Throughout the procedure, the patient did note some back discomfort and also was very anxious and required a total of 7 mg of Valium, 2 mg of Versed, and 2 mg of Nubain for sedation.  He tolerated the procedure well.  HEMODYNAMIC DATA: 1.  Initial right heart pressures were as follows: Right atrial pressure mean     6, A-wave 10, right ventricular pressure of 28/6, pulmonary artery     pressure 28/14.  Pulmonary capillary wedge pressure mean 11, A-wave 13.     Left ventricular pressure of 133/15,  central aortic pressure 133/62. 2.  Oxygen saturation was 97 percent.  Pulmonary artery was 65 percent. 3.  Cardiac output by the thermodilution method was 3.7.  Cardiac index was     1.6.  FICK method was 4.5 with a cardiac index of 1.9.  Biplane cine     left ventriculography shows a markedly dilated LV with severe global     hypercontractility and  qualitative EF of 20-25 percent.  A calculated EF     was too elevated and probably this was not accurate due to difficulty in     visualizing the endocardium.  Essentially, aortography showed a markedly     dilated aortic root with at least 3-4+ aortic regurgitation. 4.  Distal aortography showed a tortuous distal aorta.  There was no renal     artery stenosis.  There was a normal aortoiliac system. 5.  Coronary angiography.  Left main coronary artery was a long vessel that     trifurcated into an LAD intermediate vessel and a dominant left circumflex     coronary system. 6.  The LAD had smooth 20 percent proximal narrowing and, otherwise, was free     of significant disease. 7.  The intermediate vessel was a large caliber normal vessel. 8.  The dominant circumflex vessel was a large caliber vessel that gave rise     to two moderate-sized obtuse marginal branches, and ended in the posterior     descending artery and posterolateral coronary artery. 9.  The right coronary artery was a small nondominant normal vessel.  IMPRESSION: 1. Severe global LV dysfunction with qualitative ejection fraction of 20-25%. 2. Markedly dilated aortic root with 3-4+ angiographic aortic regurgitation. 3. No evidence for renal artery stenosis. 4. No significant coronary obstructive disease with smooth 20% proximal LAD    narrowing in a left circumflex-dominant system.  The above data will be reviewed with a cardiovascular surgeon.  Of concern is that the patient apparently had normal LV function one year ago in 2/02.  He apparently had been lost to followup and stopped taking his medications. Hemodynamics now show a markedly dilated LV with severe LV dysfunction probably secondary to marked volume overload probably contributed by his marked aortic regurgitant volume.  CVTS consultation will be obtained although I am concerned that the patients LV has severely deteriorated and in light of his severe AR, he  may not recover significantly.  Further recommendation will  be forthcoming. Dictated by:   Lennette Bihari, M.D. Attending Physician:  Rosalyn Charters DD:  02/17/02 TD:  02/17/02 Job: [id number]

## 2011-04-07 NOTE — Discharge Summary (Signed)
[name], [name]               ACCOUNT NO.:  [id number]   MEDICAL RECORD NO.:  [id number]          PATIENT TYPE:  INP   LOCATION:  A311                          FACILITY:  [facility]   PHYSICIAN:  Dennie Maizes, M.D.   DATE OF BIRTH:  [birth year]   DATE OF ADMISSION:  05/26/2005  DATE OF DISCHARGE:  07/16/2006LH                                 DISCHARGE SUMMARY   CONSULTING PHYSICIAN:  Hospitalist at [facility].   ORTHOPEDIC SURGEON:  Dr. Romeo Apple.   CARDIOLOGIST:  Dr. Gladstone Lighter   FINAL DIAGNOSES:  1.  Recurrent balanitis.  2. Phimosis.  3. Post aortic valve replacement      with mechanical valve.  4. Anticoagulation status.   OTHER DIAGNOSES:  Left thigh pain and numbness, probably meralgia  paresthetica.   OPERATIVE PROCEDURE:  Circumcision done on May 26, 2005.   COMPLICATIONS:  None.   DISCHARGE SUMMARY:  This 60 year old male was referred to me from the  emergency room at [facility].  He was having recurrent inflammation  of the foreskin for the past one month.  He had noticed marked scarring of  the foreskin.  Was unable to retract the foreskin.  He also experienced  tearing of the foreskin and pain after sexual intercourse.  He had  persistent problems with the foreskin.  After office evaluation, he was  brought to the short-stay center for circumcision and anesthesia.  The  patient has been on anticoagulation therapy with Coumadin for mechanical  aortic valve replacement.  I discussed with him about the risks of stopping  the anticoagulants for surgery.  He was evaluated by Dr. Alanda Amass  preoperatively and the plan was made for his stopping the Coumadin and  starting him on IV heparin postop.  I discussed with the patient in detail  regarding the risks involved with bleeding, as well as the thrombi.  Dr.  Alanda Amass and I have discussed with him on several occasions about this.   PAST MEDICAL HISTORY:  1.  History of hypertension.  2.  Status post  aortic valve replacement.   CURRENT MEDICATIONS:  1.  Folic acid 1 mg one p.o. daily.  2.  Nexium 40 mg one p.o. daily.  3.  Norvasc 5 mg one p.o. daily.  4.  Coreg 25 mg p.o. b.i.d.  5.  Coumadin 5 mg one p.o. daily.  6.  Altace 10 mg one p.o. daily.  7.  Digoxin 0.125 mg one p.o. daily.  8.  Triamterene/hydrochlorothiazide 37.5/25 one p.o. on Mondays, Wednesdays      and Friday.   FAMILY HISTORY:  Positive for diabetes mellitus and heart disease.   PHYSICAL EXAMINATION:  VITAL SIGNS:  Height 6 feet 1 inch, weight 232  pounds.  HEENT:  Normal.  NECK:  No masses.  LUNGS:  Clear to auscultation.  HEART:  Regular rate and rhythm.  No murmurs.  Status post aortic valve  replacement with mechanical valve.  ABDOMEN:  Soft, no palpable flank mass.  No CVA tenderness or bladder  distention.  GENITOURINARY:  Penis:  Recurrent balanitis with phimosis.  Testes  are  normal.  RECTAL:  Benign prostate, about 40 g in size.   HOSPITAL COURSE:  The patient's PT was 15.3 with an INR of 1.2 on May 26, 2005.  He underwent circumcision under spinal anesthesia without any  problem.  There were no complications during the surgery.   The patient was admitted to the hospital postoperatively for  anticoagulation.  The hospitalist was consulted for management of the  patient during the hospitalization.  Dr. Alanda Amass was also consulted.  Pharmacy consult was obtained for IV heparin.  The patient did well and he  did not have any postoperative bleeding.  His PT on the first postoperative  day was 15.1, PTT was 69.  His Coumadin was restarted on that day.  On May 28, 2005, there was no bleeding and there was mild edema of the penile shaft  skin.  He was voiding without any difficulty.  His PT was 15.2 and PTT was  109.  It was planned to discharge the patient when the PT and INR become  therapeutic.  The patient was also seen by cardiology on a regular basis.  The anticoagulation was very slow in  this patient with p.o. Coumadin.  On  May 29, 2005, his PT was 15.2 with an INR of 1.2.  He was maintained on IV  heparin.  On 06/01/05, the patient complained of left thigh pain and numbness  and he was seen by Dr. Romeo Apple, orthopedic surgeon.  CT of the thigh, as  well as lumbar spine was ordered.  The CT of the lumbar spine, as well as CT  of the thigh was normal.  There was no evidence of spinal stenosis or  bleeding in the thigh.  There was no lumbar related cause for the pain.  The  patient was prescribed prednisone, as well as pain medications.  He refused  to take these medications.  The patient was concerned about getting addicted  to pain medications.  I explained to the patient that we are giving only  small doses of medications to relieve his pain.   On June 01, 2005 at 1:30 p.m., he had pain and swelling of the penis.  Examination revealed small penile hematoma with mild bleeding from the  ventral aspect of the incision.  A compression dressing of the penis was  applied and the bleeding became under control.  The IV heparin was stopped  for a few hours until control of the bleeding was obtained.  He did well  with the compression dressing of the penis.  He was kept n.p.o. for a  possible return to the OR.  I discussed with the patient and his family  regarding the risks involved in anticoagulation and surgery.  The patient  was followed on a regular basis by the hospitalist and Dr. Romeo Apple for his  thigh pain.  The patient was advised to have Prednisone and Neurontin by Dr.  Romeo Apple.  The patient refused taking these medications.  During his  hospitalization, he wanted to go home.  In spite of my explanation on  several occasions that anticoagulation needs to be within therapeutic range,  he expressed frustration in his prolonged hospital stay.  He wanted to be transferred to [facility], but he changed his mind after some time.  The patient was seen by Dr. Jerre Simon  on June 03, 2005.  The patient was  agitated because of the prolonged hospital stay.  Dr. Jerre Simon explained to  the patient  and his family regarding the reasons why he is being  hospitalized.  Overall, the patient was very difficult to deal with.  He had  poor understanding of the risks involved with anticoagulation and stopping  the anticoagulation.  He refused to take the medications prescribed by the  consultants.   On June 04, 2005 he was discharged and sent home.  He will return to my  office for followup.  He will see his cardiologist for regulation of the  Coumadin dose.  He will follow up with Dr. Romeo Apple for his thigh pain and  numbness.       SK/MEDQ  D:  06/14/2005  T:  06/14/2005  Job:  [id number]

## 2011-04-07 NOTE — Consult Note (Signed)
[place]  Patient:    [name], [name] Visit Number: [id number] MRN: [id number]          Service Type: EMS Location: ED Attending Physician:  Rosalyn Charters Dictated by:   Gwenith Daily Tyrone Sage, M.D. Proc. Date: 02/17/02 Admit Date:  02/15/2002 Discharge Date: 02/15/2002   CC:         Ramon Dredge B. Tyrone Sage, M.D.  Patients chart   Consultation Report  REFERRING PHYSICIAN:  Lennette Bihari, M.D.  PHYSICIANS: 1. [name] A. Alanda Amass, M.D., follow-up cardiologist. 2. [facility] in [place], [place], [place]), primary care    physician.  REASON FOR CONSULTATION:  Severe aortic insufficiency with congestive heart failure.  HISTORY OF PRESENT ILLNESS:  The patient is a 60 year old male who was seen over a year ago in February of 2002 when an echocardiogram was done which showed an ejection fraction of 55-60%, moderate left ventricular hypertrophy, mild mitral regurgitation, aortic dilatation to 4.8 cm with moderate to severe aortic insufficiency.  There was no pericardial effusion.  Left ventricular end-diastolic dimension was 7.0.  Left ventricular end-systolic was 5.4.  The patient was seen January 14, 2001 and was noted to have severe aortic insufficiency but never had follow-up visit.  He now presents with two week history of severe congestive heart failure symptoms with shortness of breath starting first at night and then to the point where he was unable even to get to his car without getting short of breath.  He denies pedal edema.  He also complained of a band-like discomfort in the periumbilical area starting in the past several days which precipitated coming to the emergency room in [place], [place].  He has no history of myocardial infarction in the past.  He has had no previous angioplasties or catheterizations.  An echocardiogram was again repeated on January 26, 2002 which showed global severe hypokinesis with  an ejection fraction of less than 20%, moderate to severe aortic insufficiency, mild to moderate mitral regurgitation.  The copy of the report or the films were sent with the patient.  We do not know the exact size of the ventricle on the newer report.  Cardiac risk factors include hypertension.  He denies hyperlipidemia.  He does have a family history of coronary artery disease in his mother who is 23 with coronary artery disease.  He has had no previous stroke.  He has had no evidence of peripheral vascular disease, no renal insufficiency.  He denies diabetes.  He was a remote smoker, quitting 12 years ago.  PAST MEDICAL HISTORY:  Known heart murmur evaluated in February of 2002 but evidently with no follow up after this.  The patient has had no previous surgical history.  SOCIAL HISTORY:  The patient is divorced.  Girlfriend is name Lacey Jensen.  The patients daughters name is Elmarie Shiley [phone]).  He lives alone.  He is employed by [employer].  Denies any alcohol use.  MEDICATIONS:  Cough syrup and acid reflux pill but this is unknown.  ALLERGIES:  None known.  REVIEW OF SYSTEMS:  CARDIAC:  Atypical chest pain precipitating admission but for the previous two weeks the patient has had exertional and resting shortness of breath.  He has had palpitations.  Denies lower extremity edema, syncope, presyncope, orthopnea.  GENERAL:  The patient notes that he has had some low grade fevers and chills over the past week and with a cough with yellow mucus production.  RESPIRATORY:  Denies any previous history of asthma  or wheezing but approximately two weeks ago did note some episodes of wheezing.  GASTROINTESTINAL:  The patient denies any known peptic ulcer disease.  He has had a band-like periumbilical pain over the past two weeks. Denies any previous history of blood in his stools.  NEUROLOGIC:  He has had some periorbital paresthesias and paresthesias in both hands over the past  two weeks.  GENITOURINARY:  Denies any blood in his urine.  INFECTIOUS:  The patient, as noted above, thinks that he has had some intermittent fevers over the past two weeks.  ENDOCRINE:  Denies any endocrine history.  PSYCHIATRIC: Denies any psychiatric history.  HEENT:  The patient has poor dentition, especially both upper and lower.  PHYSICAL EXAMINATION:  VITAL SIGNS:  Blood pressure 122/54, pulse 62 and sinus, respiratory rate 22. Height 6 feet 1 inch tall.  Weight is 236 pounds.  He notes that he was 255 pounds but lost weight over a three week period.  GENERAL:  In general, the patient is mildly anxious about his current situation but in no acute distress and able to relate his history.  HEENT:  Pupils are round and equal to light.  He does have obvious poor dentition noted on gross exam.  NECK:  Without carotid bruits or jugular venous distension.  CHEST:  Reveals a holosystolic diastolic murmur heard throughout the anterior chest, especially along the right sternal border of 4/6.  ABDOMEN:  Positive bowel sounds.  He is slightly tender in the paraumbilical and right lower quadrant area.  There is no rebound tenderness.  Liver does not feel palpably enlarged or tender to palpation.  NEUROLOGICAL:  Gross neurologic exam is intact.  SKIN:  He has no obvious splinter hemorrhages in the nail beds or other evidence of peripheral embolization in the toes or fingers.  PULSES:  There are 2+ right DP, 2+ left DP, 2+ left posterior tibialis.  The right posterior tibialis is absent.  LABORATORY DATA:  White count 6.2, hemoglobin 13.8, hematocrit 41, platelet count 271,000.  PT 14.6, INR 1.1, total cholesterol 197, triglycerides 55, HDL 55, LDL 131.  ABGs 7.55, 24, pO2 77.  Sodium 135, potassium 3.6, BUN 31, creatinine 1.3, glucose 122.  Chest x-ray shows a massive cardiomegaly without effusion.  BNP is 620.  CT  scan of the chest was obtained at [facility]  which showed cardiomegaly with left ventricular dilatation, ectatic aorta without dissection with the ascending aorta measuring 5 cm, the descending aorta measuring 3.3 cm.  Cardiac catheterization was performed by Dr. Lennette Bihari.  PA pressures were not significantly elevated with 28/14.  There is no aortic valve gradient.  Cardiac index by thermodilution was 1.6, Fick 1.9.  The patient has a dilated ascending aorta, 3 to 4+ aortic insufficiency, overall ejection fraction approximately 25% with global hypokinesis.  The patient has a very small nondominant right coronary artery.  The remaining of the coronary system was extensive and large with at most 20-30% proximal LAD lesion.  IMPRESSION: 1. The patient with dilated ascending aorta and severe aortic insufficiency    with a significant enlargement of his aorta. 2. Poor dentition. 3. Vague abdominal discomfort, question etiology.  PLAN:  With the patients severity of the aortic insufficiency and enlargement of his ascending aorta, he needs aortic root and ascending aorta replacement, probably with a valved conduit.  Hopefully this surgery can be performed and the patient will have some functional recovery of his poor LV function.  We will  have a dental consult prior to insure there is no active infection in the mouth.  Also, we will obtain blood cultures to rule out the possibility of endocarditis but there is no evidence of this echocardiographically.  We will need to obtain the formal echocardiogram report and tape from [facility].  I have discussed the diagnosis with the patient and the probable need for surgery including the possible need for life-long Coumadin therapy with a mechanical valve.  He has no definite contraindication to taking Coumadin. The risks of such a procedure including the risks of death, infection, stroke, myocardial infarction, bleeding, and blood transfusion were discussed with the patient.   We will continue with his evaluation and probably set a time for surgery during this admission considering the severity of his symptoms. Dictated by:   Gwenith Daily Tyrone Sage, M.D. Attending Physician:  Rosalyn Charters DD:  02/17/02 TD:  02/17/02 Job: [id number]

## 2011-04-07 NOTE — Consult Note (Signed)
[place]. [facility]  Patient:    [name], [name] Visit Number: [id number] MRN: [id number]          Service Type: EMS Location: ED Attending Physician:  Rosalyn Charters Dictated by:   Cindra Eves, D.D.S. Proc. Date: 02/18/02 Admit Date:  02/15/2002 Discharge Date: 02/15/2002   CC:         [name] B. [name], M.D.  [name], M.D.  [name], M.D.   Consultation Report  DATE OF BIRTH:  06-Dec-1950  REQUESTING PHYSICIAN:  [name]. [name], M.D.  [name] is a 60 year old black male referred by Dr. Ofilia [name] for dental consultation.  Patient with anticipated need for aortic root replacement along with an aortic valve repair/replacement as indicated. Patient is now seen as part of a pre heart valve surgery dental protocol to rule out dental infection prior to the anticipated heart valve surgery.  PAST MEDICAL HISTORY: 1. Congestive heart failure-reason for this admission. 2. Aortic valve disease.    a. Status post cardiac catheterization which revealed global hypokinesis,       ejection fraction of approximately 20%, moderate to severe aortic       insufficiency, and mild to moderate mitral regurgitation.    b. Anticipated aortic root and aortic valve replacement with Dr. Ofilia [name]. 3. GERD. 4. Hypertension currently untreated. 5. History of unstable angina.  ALLERGIES/ADVERSE DRUG REACTIONS:  None known.  MEDICATIONS: 1. Digoxin 0.125 mg [name]. 2. Altace 5 mg [name]. 3. Protonix 40 mg at bedtime. 4. Potassium chloride p.r.n. 5. Aspirin 325 mg [name]. 6. Chlorhexidine rinses b.i.d. 7. Ativan 0.5-1.0 mg t.i.d. p.r.n.  SOCIAL HISTORY:  Patient is divorced.  Patient lives with his girlfriend. Patient works at [employer].  Patient with a history of smoking, but quit 12 years ago.  Patient denies use of alcohol at this time.  FAMILY HISTORY:  Mother is 30 with a history of diabetes mellitus and  coronary artery disease.  Father is 71.  FUNCTIONAL ASSESSMENT:  Patient was independent for ADLs prior to this admission.  REVIEW OF SYSTEMS:  This is reviewed from the chart and health history assessment form this admission.  DENTAL HISTORY:  CHIEF COMPLAINT:  Dental consultation was requested as part of a pre heart valve surgery dental protocol.  HISTORY OF PRESENT ILLNESS:  Patient with identification of a need for aortic root and aortic valve replacement.  Patient is now seen as part of a pre heart valve surgery dental protocol to rule out dental infection prior to the anticipated heart valve surgery.  Patient currently denies symptoms of tooth aches, swellings, or abscesses. Patient indicates that he does have an upper right root segment which has bothered him from time to time.  Patient has not seen a dentist for "a long time."  Patient indicates that this is at least three to five years ago. Patient indicates that his teeth were cleaned approximately three to five years ago.  Patient has no regular dentist.  Patient denies the presence of partial dentures.  DENTAL EXAMINATION:  GENERAL:  Patient is a well-nourished, well-developed male in no acute distress.  NECK:  There is no palpable lymphadenopathy.  There are no acute TMJ symptoms.  HEENT:  Intraoral examination:  Patient has normal saliva.  There is no evidence of soft tissue abscesses or soft tissue pathology.  Periodontal: Patient with chronic periodontitis and plaque and calculus accumulations, areas of gingival recession, incipient tooth mobility,  and moderate to severe bone loss.  Dentition:  There are multiple missing teeth noted.  There is a root segment in the area of #5.  Dental caries:  Dental caries appear to be affecting the root segment #5.  I would need a full series of periapical radiographs and bite wings to rule out other incipient dental caries. Endodontic:  Patient denies symptoms of acute  pulpitis.  Patient does have a root segment #5 which has a periapical radiolucency associated with it.  Crown and bridge:  There are no crown and bridge restorations.  Prosthodontic: Patient denies presence of partial dentures.  Occlusion:  Patient has a poor occlusal scheme secondary to multiple missing teeth, lack of replacement of the missing teeth with dentures, and supraeruption and drifting of the unopposed teeth into the edentulous areas.  Radiographic interpretation:  A panoramic x-ray was taken on February 17, 2002.  There are multiple missing teeth.  There is a root segment #5.  There is moderate to severe horizontal and vertical bone loss.  There is supraeruption and drifting of the unopposed teeth into the edentulous areas.  There is a periapical radiolucency associated with root segment #5.  There is a poor occlusal scheme.  I would need a full series of periapical radiographs and bite wings to rule out incipient dental caries and other periapical pathology.  ASSESSMENT:  1. Plaque and calculus accumulations.  2. Chronic periodontitis with bone loss.  3. Areas of selective gingival recession.  4. Incipient tooth mobility.  5. Multiple missing teeth.  6. Root segment #5.  7. Periapical pathology associated with root #5.  8. Supraeruption and drifting of the unopposed teeth into the edentulous     areas.  9. Lack of replacement of the missing teeth with clinically acceptable dental     restorations. 10. Poor occlusal scheme. 11. Need for antibiotic prophylaxis prior to invasive dental procedures as     part of an SVE antibiotic prophylaxis. 12. Risk for bleeding associated with heparin therapy.  Will need to     discontinue prior to invasive dental procedures.  PLAN/RECOMMENDATIONS: 1. I discussed the risks, benefits, complications, and various treatment     options with the patient in relationship to his medical and dental    conditions, anticipated heart valve surgery,  risk for subacute bacterial    endocarditis, and risk for bleeding associated with the current heparin    therapy.  We discussed no treatment, extractions as indicated with    alveoloplasty, periodontal therapy, dental restorations, crown and bridge    therapy, implant therapy, and the replacement of missing teeth as indicated    by the private dentist of his choice.  Patient currently wishes to only    have the root segment #5 extracted at this time with alveoloplasty as    indicated.  He also agrees to a periodontal therapy as indicated.  Patient    will follow up with the private dentist of his choice for all other dental    treatment needs. 2. Provision of written and verbal information on "Heart Valves and Mouth    Care." 3. Discussion of findings with the cardiologist and cardiovascular thoracic    surgeon as indicated to identify the medical ability/stability of the    patient to undergo dental procedures as planned as well as the antibiotic    of choice for SVE antibiotic prophylaxis and the ability to discontinue    heparin therapy at this time. Dictated by:   Windy Fast  Kulinski, D.D.S. Attending Physician:  Rosalyn Charters DD:  02/19/02 TD:  02/19/02 Job: [id number]

## 2011-12-31 ENCOUNTER — Emergency Department (HOSPITAL_COMMUNITY)
Admission: EM | Admit: 2011-12-31 | Discharge: 2011-12-31 | Disposition: A | Discharge status: Home / Self Care | Payer: Medicare Other | Attending: Emergency Medicine | Admitting: Emergency Medicine

## 2011-12-31 ENCOUNTER — Encounter (HOSPITAL_COMMUNITY): Payer: Self-pay

## 2011-12-31 DIAGNOSIS — L0390 Cellulitis, unspecified: Secondary | ICD-10-CM

## 2011-12-31 DIAGNOSIS — L02419 Cutaneous abscess of limb, unspecified: Secondary | ICD-10-CM | POA: Insufficient documentation

## 2011-12-31 DIAGNOSIS — Z9861 Coronary angioplasty status: Secondary | ICD-10-CM | POA: Insufficient documentation

## 2011-12-31 DIAGNOSIS — Z7901 Long term (current) use of anticoagulants: Secondary | ICD-10-CM | POA: Insufficient documentation

## 2011-12-31 DIAGNOSIS — I2510 Atherosclerotic heart disease of native coronary artery without angina pectoris: Secondary | ICD-10-CM | POA: Insufficient documentation

## 2011-12-31 DIAGNOSIS — I10 Essential (primary) hypertension: Secondary | ICD-10-CM | POA: Insufficient documentation

## 2011-12-31 HISTORY — DX: Essential (primary) hypertension: I10

## 2011-12-31 HISTORY — DX: Atherosclerotic heart disease of native coronary artery without angina pectoris: I25.10

## 2011-12-31 MED ORDER — DOXYCYCLINE HYCLATE 100 MG PO CAPS: 100.0000 mg | ORAL_CAPSULE | Freq: Two times a day (BID) | ORAL | Status: AC

## 2011-12-31 MED ORDER — DOXYCYCLINE HYCLATE 100 MG PO TABS
100.0000 mg | ORAL_TABLET | Freq: Once | ORAL | Status: AC
  Administered 2011-12-31: 100 mg via ORAL
  Filled 2011-12-31: qty 1

## 2011-12-31 NOTE — ED Notes (Signed)
Pt reports hit r lower leg on something a couple of days ago.  Now area red and swollen.

## 2011-12-31 NOTE — ED Notes (Signed)
Patient with no complaints at this time. Respirations even and unlabored. Skin warm/dry. Discharge instructions reviewed with patient at this time. Patient given opportunity to voice concerns/ask questions. Patient discharged at this time and left Emergency Department with steady gait.   

## 2012-01-01 NOTE — ED Provider Notes (Signed)
History     CSN: [id number]  Arrival date & time 12/31/11  4782   First MD Initiated Contact with Patient 12/31/11 0930      Chief Complaint  Patient presents with  . Leg Pain    (Consider location/radiation/quality/duration/timing/severity/associated sxs/prior treatment) Patient is a 61 y.o. male presenting with leg pain. The history is provided by the patient and the spouse.  Leg Pain  Incident onset: 3 days ago. The incident occurred at home. Injury mechanism: Patient abraded his right anterior lower leg on a rock while walking in his yard 3 days ago. He has had increased pain, swelling and redness since the event. The pain is present in the right leg. The pain is at a severity of 7/10. The pain is moderate. The pain has been constant since onset. Pertinent negatives include no numbness, no inability to bear weight, no loss of sensation and no tingling. He has tried nothing for the symptoms.    Past Medical History  Diagnosis Date  . Coronary artery disease   . Hypertension     Past Surgical History  Procedure Date  . Exploration post operative open heart     open heart surgery  . Coronary angioplasty with stent placement     No family history on file.  History  Substance Use Topics  . Smoking status: Never Smoker   . Smokeless tobacco: Not on file  . Alcohol Use: No      Review of Systems  Constitutional: Negative for fever.  HENT: Negative for congestion, sore throat and neck pain.   Eyes: Negative.   Respiratory: Negative for chest tightness and shortness of breath.   Cardiovascular: Negative for chest pain.  Gastrointestinal: Negative for nausea and abdominal pain.  Genitourinary: Negative.   Musculoskeletal: Negative for joint swelling and arthralgias.  Skin: Positive for color change and wound. Negative for rash.  Neurological: Negative for dizziness, tingling, weakness, light-headedness, numbness and headaches.  Hematological: Negative.     Psychiatric/Behavioral: Negative.     Allergies  Aspirin  Home Medications   Current Outpatient Rx  Name Route Sig Dispense Refill  . DIGOXIN 0.25 MG PO TABS Oral Take 250 mcg by mouth daily.    . WARFARIN SODIUM 5 MG PO TABS Oral Take 5 mg by mouth daily.    Marland Kitchen DOXYCYCLINE HYCLATE 100 MG PO CAPS Oral Take 1 capsule (100 mg total) by mouth 2 (two) times daily. 20 capsule 0    BP 185/100  Pulse 57  Temp 98.1 F (36.7 C)  Resp 20  Ht 6\' 1"  (1.854 m)  Wt 280 lb (127.007 kg)  BMI 36.94 kg/m2  SpO2 100%  Physical Exam  Nursing note and vitals reviewed. Constitutional: He is oriented to person, place, and time. He appears well-developed and well-nourished. No distress.  HENT:  Head: Normocephalic and atraumatic.  Eyes: Conjunctivae are normal.  Neck: Normal range of motion.  Cardiovascular: Normal rate, regular rhythm, normal heart sounds and intact distal pulses.   Pulmonary/Chest: Effort normal. He has no rales.  Abdominal: He exhibits no distension.  Musculoskeletal: Normal range of motion. He exhibits tenderness.  Neurological: He is alert and oriented to person, place, and time.  Skin: Skin is warm and dry. There is erythema.          3 cm linear scabbed abrasion right mid tibia with surrounding 2 cm area of increased erythema and tenderness. Patient has subtle edema surrounding this lesion of approximately 6 cm x 10 cm, area  marked with skin marker pen. No fluctuance, pointing or induration.  Psychiatric: He has a normal mood and affect.    ED Course  Procedures (including critical care time)  Labs Reviewed - No data to display No results found.   1. Cellulitis       MDM  Patient was started on doxycycline today, with instructions to elevate his leg and use warm compresses as much as possible. Recheck in 2 days if his symptoms are not starting to improve or if they worsen in any way. Also discussed blood pressure. Patient is on blood pressure medicines,  cannot recall the names, and has not taken his blood pressure medicine yet today strongly encouraged to take his medicines as soon as he arrives home. Also discussed his Coumadin, patient stating his INR was checked less than one week ago and was 2.4. He is scheduled for recheck of his Coumadin next week as he is on a two-week lab visit regimen.        Candis Musa, PA 01/01/12 [phone]

## 2012-01-01 NOTE — ED Provider Notes (Signed)
Medical screening examination/treatment/procedure(s) were performed by non-physician practitioner and as supervising physician I was immediately available for consultation/collaboration.   Benny Lennert, MD 01/01/12 [phone]

## 2012-03-30 ENCOUNTER — Encounter (HOSPITAL_COMMUNITY): Payer: Self-pay | Admitting: Emergency Medicine

## 2012-03-30 ENCOUNTER — Emergency Department (HOSPITAL_COMMUNITY): Payer: Medicare Other

## 2012-03-30 ENCOUNTER — Emergency Department (HOSPITAL_COMMUNITY)
Admission: EM | Admit: 2012-03-30 | Discharge: 2012-03-30 | Disposition: A | Discharge status: Home / Self Care | Payer: Medicare Other | Attending: Emergency Medicine | Admitting: Emergency Medicine

## 2012-03-30 DIAGNOSIS — R319 Hematuria, unspecified: Secondary | ICD-10-CM | POA: Insufficient documentation

## 2012-03-30 DIAGNOSIS — N390 Urinary tract infection, site not specified: Secondary | ICD-10-CM | POA: Insufficient documentation

## 2012-03-30 DIAGNOSIS — R109 Unspecified abdominal pain: Secondary | ICD-10-CM | POA: Insufficient documentation

## 2012-03-30 DIAGNOSIS — I2510 Atherosclerotic heart disease of native coronary artery without angina pectoris: Secondary | ICD-10-CM | POA: Insufficient documentation

## 2012-03-30 DIAGNOSIS — T45515A Adverse effect of anticoagulants, initial encounter: Secondary | ICD-10-CM

## 2012-03-30 DIAGNOSIS — D689 Coagulation defect, unspecified: Secondary | ICD-10-CM | POA: Insufficient documentation

## 2012-03-30 DIAGNOSIS — I10 Essential (primary) hypertension: Secondary | ICD-10-CM | POA: Insufficient documentation

## 2012-03-30 LAB — URINALYSIS, ROUTINE W REFLEX MICROSCOPIC
Leukocytes, UA: NEGATIVE
Specific Gravity, Urine: 1.025 (ref 1.005–1.030)
pH: 5.5 (ref 5.0–8.0)

## 2012-03-30 LAB — URINE MICROSCOPIC-ADD ON

## 2012-03-30 LAB — DIFFERENTIAL
Basophils Absolute: 0 10*3/uL (ref 0.0–0.1)
Basophils Relative: 0 % (ref 0–1)
Eosinophils Relative: 2 % (ref 0–5)
Lymphocytes Relative: 16 % (ref 12–46)
Lymphs Abs: 0.9 10*3/uL (ref 0.7–4.0)
Monocytes Relative: 11 % (ref 3–12)
Neutro Abs: 4.3 10*3/uL (ref 1.7–7.7)

## 2012-03-30 LAB — BASIC METABOLIC PANEL
CO2: 21 mEq/L (ref 19–32)
Chloride: 104 mEq/L (ref 96–112)
GFR calc non Af Amer: 74 mL/min — ABNORMAL LOW (ref >90)
Glucose, Bld: 98 mg/dL (ref 70–99)
Potassium: 3.7 mEq/L (ref 3.5–5.1)
Sodium: 135 mEq/L (ref 135–145)

## 2012-03-30 LAB — CBC
Hemoglobin: 11.6 g/dL — ABNORMAL LOW (ref 13.0–17.0)
MCH: 22.8 pg — ABNORMAL LOW (ref 26.0–34.0)
MCV: 72.3 fL — ABNORMAL LOW (ref 78.0–100.0)
RBC: 5.09 MIL/uL (ref 4.22–5.81)
WBC: 5.9 10*3/uL (ref 4.0–10.5)

## 2012-03-30 LAB — PROTIME-INR: INR: 7.8 (ref 0.00–1.49)

## 2012-03-30 MED ORDER — DEXTROSE 5 % IV SOLN
1.0000 g | Freq: Once | INTRAVENOUS | Status: AC
  Administered 2012-03-30: 1 g via INTRAVENOUS
  Filled 2012-03-30: qty 10

## 2012-03-30 MED ORDER — AZITHROMYCIN 250 MG PO TABS
1000.0000 mg | ORAL_TABLET | Freq: Once | ORAL | Status: AC
  Administered 2012-03-30: 1000 mg via ORAL
  Filled 2012-03-30: qty 4

## 2012-03-30 MED ORDER — SODIUM CHLORIDE 0.9 % IV BOLUS (SEPSIS)
1000.0000 mL | Freq: Once | INTRAVENOUS | Status: AC
  Administered 2012-03-30: 1000 mL via INTRAVENOUS

## 2012-03-30 MED ORDER — PHYTONADIONE 5 MG PO TABS
ORAL_TABLET | ORAL | Status: AC
  Filled 2012-03-30: qty 1

## 2012-03-30 MED ORDER — IBUPROFEN 800 MG PO TABS: 800.0000 mg | ORAL_TABLET | Freq: Three times a day (TID) | ORAL | Status: AC

## 2012-03-30 MED ORDER — PHYTONADIONE 5 MG PO TABS
5.0000 mg | ORAL_TABLET | Freq: Once | ORAL | Status: AC
  Administered 2012-03-30: 5 mg via ORAL
  Filled 2012-03-30: qty 1

## 2012-03-30 MED ORDER — HYDROCODONE-ACETAMINOPHEN 5-325 MG PO TABS: 2.0000 | ORAL_TABLET | ORAL | Status: AC | PRN

## 2012-03-30 MED ORDER — CIPROFLOXACIN HCL 500 MG PO TABS: 500.0000 mg | ORAL_TABLET | Freq: Two times a day (BID) | ORAL | Status: AC

## 2012-03-30 MED ORDER — ONDANSETRON HCL 4 MG/2ML IJ SOLN
4.0000 mg | Freq: Once | INTRAMUSCULAR | Status: AC
  Administered 2012-03-30: 4 mg via INTRAVENOUS
  Filled 2012-03-30: qty 2

## 2012-03-30 MED ORDER — HYDROMORPHONE HCL PF 1 MG/ML IJ SOLN
1.0000 mg | Freq: Once | INTRAMUSCULAR | Status: AC
  Administered 2012-03-30: 1 mg via INTRAVENOUS
  Filled 2012-03-30: qty 1

## 2012-03-30 NOTE — ED Notes (Signed)
L flank pain and hematuria x 2 days. Also c/o knot on r wrist x one day-painful.

## 2012-03-30 NOTE — Discharge Instructions (Signed)
Coagulopathy Take the antibiotic as prescribed. Do not take her Coumadin tomorrow and call Dr. Alanda Amass first thing Monday to have your level rechecked. Return to the ED if you develop new or worsening symptoms. Coagulopathy (bleeding disorder) is an abnormal condition that makes it difficult for blood to clot. Platelets (specialized blood cells) and clotting factors (special blood proteins) combine to help the blood clot when a cut has been made in the skin. If platelets and clotting factors are not present in sufficient numbers, clots cannot form properly to stop bleeding.  CAUSES  Bleeding disorders usually have two causes:  Acquired bleeding disorders are due to a disease process or are drug induced, such as:   Severe liver disease.   Over treatment of Coumadin (Warfarin).   Drug induced Immune Thrombocytopenia. Heparin is a common cause.   Bone marrow disorders.   Lupus.   Prolonged use of antibiotics may cause vitamin K deficiency.   Congenital (Inherited) bleeding disorders.   Hemophilia.   Deficiencies of clotting Factors II, V, VII, IX, X.   Von Willebrands Disease. People with this disease are especially prone to bleeding if aspirin or NSAIDS (nonsteroidal anti-inflammatory drugs) products are taken.  SYMPTOMS  Whether the coagulopathy is acquired or congenital, symptoms are usually the same:   Unusual bleeding or bleeding very easily such as:   Nosebleeds.   Bloody stools or blood in the urine.   Abnormal or very heavy menstrual bleeding.   Cuts that bleed excessively.   Excessive bruising:   Bruising very easily.   Unexplained bruising.   Dizziness.  DIAGNOSIS  Coagulopathy is usually diagnosed by blood tests and a physical exam. This includes:  Clotting factor blood tests such as:   Partial Thromboplastin Time (PTT) and Prothrombin Time (PT). These tests show how long it takes your blood to clot. PTT and PT results may be high with a bleeding  disorder.   Platelet counts. Platelets are a type of blood cell that help stop bleeding. The platelet count may be low with a bleeding disorder.   Fibrinogen levels. Fibrinogen is a protein produced by the liver. It helps blood to clot. The Fibrinogen level may be low with bleeding disorder.   A physical exam may reveal bruising throughout the body.   Small petechiae (small pinpoint red or purple "dots"). Petechiae has a rash like appearance but does not itch. It may appear anywhere on the body, including the hands, feet, inside the mouth or even in the whites of the eyes.  TREATMENT  Treatment depends on whether the coagulopathy is acquired or congenital.   If clotting factors are low (deficient), these may need to be replaced.   Platelet transfusions may need to be given.   If your caregiver has determined that a medication is the cause of the bleeding disorder, your caregiver may need to adjust or discontinue the medication.   Treatment of the underlying disease state to correct the coagulopathy.   If your Vitamin K level is low, it will need to be replaced.  COMPLICATIONS OF COAGULOPATHY A bleeding disorder can develop very serious complications such as:  Bleeding into the brain (Intracerebral bleeding).   Liver failure.   Kidney failure.   Gastrointestinal bleeding.  SEEK IMMEDIATE MEDICAL CARE IF:  You have large areas of bruising.   You have a severe headache that does not go away.   You have blood in your urine or stool.   You vomit blood.   You develop redness or  rashes on your skin.   You become dizzy or pass out (loss of consciousness).   You develop chest pain or shortness of breath. If the chest pain or shortness of breath is severe, call your local emergency service immediately.  MAKE SURE YOU:   Understand these instructions.   Will watch your condition.   Will get help right away if you are not doing well or get worse.  Document Released:  08/26/2004 Document Revised: 10/26/2011 Document Reviewed: 12/17/2008 [facility] Patient Information 2012 [place], [place].Hematuria, Adult Hematuria (blood in your urine) can be caused by a bladder infection (cystitis), kidney infection (pyelonephritis), prostate infection (prostatitis), or kidney stone. Infections will usually respond to antibiotics (medications which kill germs), and a kidney stone will usually pass through your urine without further treatment. If you were put on antibiotics, take all the medicine until gone. You may feel better in a few days, but take all of your medicine or the infection may not respond and become more difficult to treat. If antibiotics were not given, an infection did not cause the blood in the urine. A further work up to find out the reason may be needed. HOME CARE INSTRUCTIONS   Drink lots of fluid, 3 to 4 quarts a day. If you have been diagnosed with an infection, cranberry juice is especially recommended, in addition to large amounts of water.   Avoid caffeine, tea, and carbonated beverages, because they tend to irritate the bladder.   Avoid alcohol as it may irritate the prostate.   Only take over-the-counter or prescription medicines for pain, discomfort, or fever as directed by your caregiver.   If you have been diagnosed with a kidney stone follow your caregivers instructions regarding straining your urine to catch the stone.  TO PREVENT FURTHER INFECTIONS:  Empty the bladder often. Avoid holding urine for long periods of time.   After a bowel movement, women should cleanse front to back. Use each tissue only once.   Empty the bladder before and after sexual intercourse if you are a male.   Return to your caregiver if you develop back pain, fever, nausea (feeling sick to your stomach), vomiting, or your symptoms (problems) are not better in 3 days. Return sooner if you are getting worse.  If you have been requested to return for further testing  make sure to keep your appointments. If an infection is not the cause of blood in your urine, X-rays may be required. Your caregiver will discuss this with you. SEEK IMMEDIATE MEDICAL CARE IF:   You have a persistent fever over 102 F (38.9 C).   You develop severe vomiting and are unable to keep the medication down.   You develop severe back or abdominal pain despite taking your medications.   You begin passing a large amount of blood or clots in your urine.   You feel extremely weak or faint, or pass out.  MAKE SURE YOU:   Understand these instructions.   Will watch your condition.   Will get help right away if you are not doing well or get worse.  Document Released: 11/06/2005 Document Revised: 10/26/2011 Document Reviewed: 06/25/2008 [facility] Patient Information 2012 [place], [place].

## 2012-03-30 NOTE — ED Notes (Signed)
States both testicles hurt but denies swelling. C/o head ache.

## 2012-03-30 NOTE — ED Notes (Signed)
AC called for Vit K tablet. Pt currently in CT dept.

## 2012-03-30 NOTE — ED Provider Notes (Signed)
History     CSN: [id number]  Arrival date & time 03/30/12  1256   First MD Initiated Contact with Patient 03/30/12 1427      Chief Complaint  Patient presents with  . Flank Pain  . Hematuria    (Consider location/radiation/quality/duration/timing/severity/associated sxs/prior treatment) HPI Comments: Hx mechanical heart valve (unsure which one) on coumadin followed by Dr. Alanda Amass presenting with low back pain, L flank pain and hematuria for the past 2 days.  Has pain with urination.  No history of kidney stones or UTI.  Pain radiates to left groin and testicles.  Not taking anything for pain at home.  No chest pain, SOB, cough, fever.  No nausea or vomiting.  The history is provided by the patient and the spouse.    Past Medical History  Diagnosis Date  . Coronary artery disease   . Hypertension     Past Surgical History  Procedure Date  . Exploration post operative open heart     open heart surgery  . Coronary angioplasty with stent placement     History reviewed. No pertinent family history.  History  Substance Use Topics  . Smoking status: Never Smoker   . Smokeless tobacco: Not on file  . Alcohol Use: No      Review of Systems  Constitutional: Negative for fever and activity change.  HENT: Negative for congestion and rhinorrhea.   Respiratory: Negative for cough, chest tightness and shortness of breath.   Cardiovascular: Negative for chest pain.  Gastrointestinal: Negative for nausea, vomiting and abdominal pain.  Genitourinary: Positive for dysuria, hematuria and flank pain. Negative for testicular pain.  Musculoskeletal: Positive for back pain.  Neurological: Negative for headaches.    Allergies  Aspirin  Home Medications   Current Outpatient Rx  Name Route Sig Dispense Refill  . DIGOXIN 0.25 MG PO TABS Oral Take 250 mcg by mouth daily.    Marland Kitchen PRESCRIPTION MEDICATION Oral Take 1 capsule by mouth daily. Patient takes for blood pressure unsure of  name and strength    . WARFARIN SODIUM 1 MG PO TABS Oral Take 3 mg by mouth daily.    Marland Kitchen CIPROFLOXACIN HCL 500 MG PO TABS Oral Take 1 tablet (500 mg total) by mouth every 12 (twelve) hours. 14 tablet 0  . HYDROCODONE-ACETAMINOPHEN 5-325 MG PO TABS Oral Take 2 tablets by mouth every 4 (four) hours as needed for pain. 10 tablet 0  . IBUPROFEN 800 MG PO TABS Oral Take 1 tablet (800 mg total) by mouth 3 (three) times daily. 21 tablet 0    BP 161/101  Pulse 68  Temp(Src) 98.2 F (36.8 C) (Oral)  Resp 20  Ht 6\' 1"  (1.854 m)  Wt 291 lb (131.997 kg)  BMI 38.39 kg/m2  SpO2 98%  Physical Exam  Constitutional: He is oriented to person, place, and time. He appears well-developed and well-nourished. No distress.  HENT:  Head: Normocephalic and atraumatic.  Mouth/Throat: Oropharynx is clear and moist. No oropharyngeal exudate.  Eyes: Conjunctivae and EOM are normal. Pupils are equal, round, and reactive to light.  Neck: Normal range of motion. Neck supple.  Cardiovascular: Normal rate, regular rhythm and normal heart sounds.   No murmur heard. Pulmonary/Chest: Effort normal and breath sounds normal. No respiratory distress.  Abdominal: Soft. Bowel sounds are normal. There is no tenderness. There is no rebound.  Genitourinary: Prostate normal.       No testicular tenderness Prostate is firm, nonboggy, nontender  Musculoskeletal: Normal range of motion.  He exhibits tenderness. He exhibits no edema.       Left CVA tenderness, bilateral lumbar paraspinal tenderness  Neurological: He is alert and oriented to person, place, and time.  Skin: Skin is warm.    ED Course  Procedures (including critical care time)  Labs Reviewed  URINALYSIS, ROUTINE W REFLEX MICROSCOPIC - Abnormal; Notable for the following:    Color, Urine BROWN (*) BIOCHEMICALS MAY BE AFFECTED BY COLOR   APPearance CLOUDY (*)    Hgb urine dipstick LARGE (*)    Bilirubin Urine MODERATE (*)    Protein, ur 100 (*)    Nitrite  POSITIVE (*)    All other components within normal limits  URINE MICROSCOPIC-ADD ON - Abnormal; Notable for the following:    Bacteria, UA MANY (*)    All other components within normal limits  CBC - Abnormal; Notable for the following:    Hemoglobin 11.6 (*)    HCT 36.8 (*)    MCV 72.3 (*)    MCH 22.8 (*)    RDW 16.2 (*)    All other components within normal limits  BASIC METABOLIC PANEL - Abnormal; Notable for the following:    GFR calc non Af Amer 74 (*)    GFR calc Af Amer 86 (*)    All other components within normal limits  PROTIME-INR - Abnormal; Notable for the following:    Prothrombin Time 66.7 (*)    INR 7.80 (*)    All other components within normal limits  DIFFERENTIAL  URINE CULTURE   Ct Abdomen Pelvis Wo Contrast  03/30/2012  *RADIOLOGY REPORT*  Clinical Data: Flank pain, hematuria  CT ABDOMEN AND PELVIS WITHOUT CONTRAST  Technique:  Multidetector CT imaging of the abdomen and pelvis was performed following the standard protocol without intravenous contrast.  Comparison: None.  Findings: Lung bases are clear no pericardial fluid.  Non-IV contrast images demonstrate no focal hepatic lesion.  Several small gallstones in the gallbladder.  The pancreas, spleen, and adrenal glands are normal.  No nephrolithiasis or ureterolithiasis. Two low density cyst within the right kidney.  The stomach, small bowel, appendix, and cecum are normal.  The colon and rectosigmoid colon normal.  Abdominal aorta normal caliber.  No retroperitoneal periportal lymphadenopathy.  No free fluid the pelvis.  Prostate gland bladder normal.  No pelvic lymphadenopathy.  Mild atherosclerotic disease is present. There is degenerative changes of the lower lumbar spine.  IMPRESSION:  1.  No nephrolithiasis or ureterolithiasis. 2.  Low density cysts within the right kidney are likely benign. 3.  Degenerative changes of the lower spine with facet hypertrophy.  Original Report Authenticated By: Genevive Bi, M.D.       1. Urinary tract infection   2. Warfarin-induced coagulopathy   3. Hematuria       MDM   flank pain, hematuria, history of Coumadin use. Vitals stable, no distress, no neurological deficits, 5 out of 5 strength throughout.  Urinalysis with infection and large blood. INR elevated at 7.8.  No nephrolithiasis. Pain controlled.  Will treat UTI.  No evidence of prostatitis on exam. 5mg  vitamin K given. Patient informed to hold coumadin tomorrow and call Dr. Alanda Amass on Monday for recheck.         Glynn Octave, MD 03/30/12 [phone]

## 2012-03-31 LAB — URINE CULTURE: Colony Count: NO GROWTH

## 2012-04-01 ENCOUNTER — Other Ambulatory Visit (HOSPITAL_COMMUNITY): Payer: Self-pay | Admitting: Urology

## 2012-04-01 DIAGNOSIS — R310 Gross hematuria: Secondary | ICD-10-CM

## 2012-04-01 DIAGNOSIS — N400 Enlarged prostate without lower urinary tract symptoms: Secondary | ICD-10-CM

## 2012-04-03 ENCOUNTER — Ambulatory Visit (HOSPITAL_COMMUNITY)
Admission: RE | Admit: 2012-04-03 | Discharge: 2012-04-03 | Disposition: A | Discharge status: Home / Self Care | Payer: Medicare Other | Source: Ambulatory Visit | Attending: Urology | Admitting: Urology

## 2012-04-03 DIAGNOSIS — N400 Enlarged prostate without lower urinary tract symptoms: Secondary | ICD-10-CM

## 2012-04-03 DIAGNOSIS — Q619 Cystic kidney disease, unspecified: Secondary | ICD-10-CM | POA: Insufficient documentation

## 2012-04-03 DIAGNOSIS — R310 Gross hematuria: Secondary | ICD-10-CM

## 2012-04-03 DIAGNOSIS — R319 Hematuria, unspecified: Secondary | ICD-10-CM | POA: Insufficient documentation

## 2012-04-03 DIAGNOSIS — R1032 Left lower quadrant pain: Secondary | ICD-10-CM | POA: Insufficient documentation

## 2012-04-03 MED ORDER — IOHEXOL 300 MG/ML  SOLN
100.0000 mL | Freq: Once | INTRAMUSCULAR | Status: AC | PRN
  Administered 2012-04-03: 100 mL via INTRAVENOUS

## 2012-04-04 MED ORDER — DIPHENHYDRAMINE HCL 25 MG PO CAPS
50.0000 mg | ORAL_CAPSULE | Freq: Once | ORAL | Status: AC
Start: 2012-04-04 — End: 2012-04-04
  Administered 2012-04-04: 50 mg via ORAL
  Filled 2012-04-04: qty 1

## 2012-06-19 NOTE — Patient Instructions (Addendum)
20 Salomon Ganser  06/19/2012   Your procedure is scheduled on:  06/25/2012  Report to [facility] at  730  AM.  Call this number if you have problems the morning of surgery: [phone]   Remember:   Do not eat food:After Midnight.  May have clear liquids:until Midnight .    Take these medicines the morning of surgery with A SIP OF WATER: lanoxin   Do not wear jewelry, make-up or nail polish.  Do not wear lotions, powders, or perfumes. You may wear deodorant.  Do not shave 48 hours prior to surgery. Men may shave face and neck.  Do not bring valuables to the hospital.  Contacts, dentures or bridgework may not be worn into surgery.  Leave suitcase in the car. After surgery it may be brought to your room.  For patients admitted to the hospital, checkout time is 11:00 AM the day of discharge.   Patients discharged the day of surgery will not be allowed to drive home.  Name and phone number of your driver: family  Special Instructions: CHG Shower Use Special Wash: 1/2 bottle night before surgery and 1/2 bottle morning of surgery.   Please read over the following fact sheets that you were given: Pain Booklet, MRSA Information, Surgical Site Infection Prevention, Anesthesia Post-op Instructions and Care and Recovery After Surgery Cystoscopy (Bladder Exam) A cystoscopy is an examination of your urinary bladder with a cystoscope. A cystoscope is an instrument like a small telescope with strong lights and lenses. It is inserted into the bladder through the urethra (the opening into the bladder) and allows your caregiver to examine the inside of your bladder. The procedure causes little discomfort and can be done in a hospital or office. It is a diagnostic procedure to evaluate the inside of your bladder. It may involve x-rays to further evaluate the ureters or internal aspects of the kidneys. It may aid in the removal of urinary stones  or in taking tissue samples (biopsies) if necessary. The  procedure is easier in females because of a shorter urethra. In a male, the procedure must be done through the penis. This often requires more sedation and more time to do the procedure. The procedure usually takes twenty minutes to one half hour for a male and approximately an hour for a male. LET YOUR CAREGIVERS KNOW ABOUT:  Allergies.   Medications taken including herbs, eye drops, over the counter medications, and creams.   Use of steroids (by mouth or creams).   Previous problems with anesthetics or novocaine.   Possibility of pregnancy, if this applies.   History of blood clots (thrombophlebitis).   History of bleeding or blood problems.   Previous surgery, especially where prosthetics have been used like hip or knee replacements, and heart valve replacements.   Other health problems.  BEFORE THE PROCEDURE  You should be present 60 minutes prior to your procedure or as directed.  PROCEDURE During the procedure, you will:  Be assisted by your urologist and a nurse.   Lie on a cystoscopy table with your knees elevated and legs apart and covered with a drape. For women this is the same position as when a pap smear is taken.   Have the urethral area or penis washed and covered with sterile towels.   Have an anesthetic (numbing) jelly applied to the urethra. This is usually all that is required for females but males may also require sedation.   Have the cystoscope inserted through  the urethra and into the bladder. Sterile fluid will flow through the cystoscope and into the bladder. This will expand the bladder and provide clear fluid for the urologist to look through and examine the interior of the bladder.   Be allowed to go home once you are doing well, are stable, and awake if you were given a sedative. If given a sedative, have someone give you a ride home.  AFTER THE PROCEDURE  You may have temporary bleeding and burning on urination.   Drink lots of fluids.  SEEK  IMMEDIATE MEDICAL CARE IF:  There is an increase in blood in the urine or if you are passing clots.   You have difficulty in passing your urine   You develop chills and/or an unexplained oral temperature above 102 F (38.9 C).  Your caregiver will discuss your results with you following the procedure. This may be at a later time if you have been sedated. If other testing or biopsies were taken, ask your caregiver how you are to obtain the results. Remember it is your responsibility to get your results. Do not assume everything is normal if you do not hear from your caregiver. Document Released: 11/03/2000 Document Revised: 10/26/2011 Document Reviewed: 08/27/2008 [facility] Patient Information 2012 [place], [place].PATIENT INSTRUCTIONS POST-ANESTHESIA  IMMEDIATELY FOLLOWING SURGERY:  Do not drive or operate machinery for the first twenty four hours after surgery.  Do not make any important decisions for twenty four hours after surgery or while taking narcotic pain medications or sedatives.  If you develop intractable nausea and vomiting or a severe headache please notify your doctor immediately.  FOLLOW-UP:  Please make an appointment with your surgeon as instructed. You do not need to follow up with anesthesia unless specifically instructed to do so.  WOUND CARE INSTRUCTIONS (if applicable):  Keep a dry clean dressing on the anesthesia/puncture wound site if there is drainage.  Once the wound has quit draining you may leave it open to air.  Generally you should leave the bandage intact for twenty four hours unless there is drainage.  If the epidural site drains for more than 36-48 hours please call the anesthesia department.  QUESTIONS?:  Please feel free to call your physician or the hospital operator if you have any questions, and they will be happy to assist you.

## 2012-06-20 ENCOUNTER — Encounter (HOSPITAL_COMMUNITY): Payer: Self-pay

## 2012-06-20 ENCOUNTER — Ambulatory Visit (HOSPITAL_COMMUNITY)
Admission: RE | Admit: 2012-06-20 | Discharge: 2012-06-20 | Disposition: A | Discharge status: Home / Self Care | Payer: Medicare Other | Source: Ambulatory Visit | Attending: Urology | Admitting: Urology

## 2012-06-20 ENCOUNTER — Other Ambulatory Visit: Payer: Self-pay

## 2012-06-20 DIAGNOSIS — Z01810 Encounter for preprocedural cardiovascular examination: Secondary | ICD-10-CM | POA: Insufficient documentation

## 2012-06-20 DIAGNOSIS — Z538 Procedure and treatment not carried out for other reasons: Secondary | ICD-10-CM | POA: Insufficient documentation

## 2012-06-20 DIAGNOSIS — R310 Gross hematuria: Secondary | ICD-10-CM | POA: Insufficient documentation

## 2012-06-20 HISTORY — DX: Other specified health status: Z78.9

## 2012-06-20 LAB — BASIC METABOLIC PANEL
BUN: 17 mg/dL (ref 6–23)
CO2: 24 mEq/L (ref 19–32)
Calcium: 8.7 mg/dL (ref 8.4–10.5)
Chloride: 106 mEq/L (ref 96–112)
Creatinine, Ser: 1.09 mg/dL (ref 0.50–1.35)
GFR calc Af Amer: 85 mL/min — ABNORMAL LOW (ref >90)
GFR calc non Af Amer: 73 mL/min — ABNORMAL LOW (ref >90)
Glucose, Bld: 100 mg/dL — ABNORMAL HIGH (ref 70–99)
Potassium: 3.5 mEq/L (ref 3.5–5.1)
Sodium: 139 mEq/L (ref 135–145)

## 2012-06-20 LAB — HEMOGLOBIN AND HEMATOCRIT, BLOOD: HCT: 35.5 % — ABNORMAL LOW (ref 39.0–52.0)

## 2012-06-24 NOTE — OR Nursing (Signed)
Pt. Came for preop and was on coumadin which had not been stopped. Waiting to see if pt. Is going to be recscheduled.

## 2012-06-25 ENCOUNTER — Ambulatory Visit (HOSPITAL_COMMUNITY): Admission: RE | Admit: 2012-06-25 | Payer: Medicare Other | Source: Ambulatory Visit | Admitting: Urology

## 2012-06-25 ENCOUNTER — Encounter (HOSPITAL_COMMUNITY): Admission: RE | Payer: Self-pay | Source: Ambulatory Visit

## 2012-06-25 SURGERY — CYSTOSCOPY
Anesthesia: Choice | Site: Bladder

## 2012-12-02 ENCOUNTER — Ambulatory Visit (HOSPITAL_COMMUNITY)
Admission: RE | Admit: 2012-12-02 | Discharge: 2012-12-02 | Disposition: A | Discharge status: Home / Self Care | Payer: Medicare Other | Source: Ambulatory Visit | Attending: Cardiovascular Disease | Admitting: Cardiovascular Disease

## 2012-12-02 DIAGNOSIS — R0989 Other specified symptoms and signs involving the circulatory and respiratory systems: Secondary | ICD-10-CM | POA: Insufficient documentation

## 2012-12-02 DIAGNOSIS — R0609 Other forms of dyspnea: Secondary | ICD-10-CM | POA: Insufficient documentation

## 2012-12-02 NOTE — Progress Notes (Signed)
*  PRELIMINARY RESULTS* Echocardiogram 2D Echocardiogram has been performed.  Conrad Platinum 12/02/2012, 1:36 PM

## 2013-03-07 ENCOUNTER — Encounter: Payer: Self-pay | Admitting: Pharmacist Clinician (PhC)/ Clinical Pharmacy Specialist

## 2013-03-07 DIAGNOSIS — Z7901 Long term (current) use of anticoagulants: Secondary | ICD-10-CM | POA: Insufficient documentation

## 2013-03-07 DIAGNOSIS — Z952 Presence of prosthetic heart valve: Secondary | ICD-10-CM

## 2013-04-07 ENCOUNTER — Ambulatory Visit: Payer: Medicare Other

## 2013-04-08 ENCOUNTER — Ambulatory Visit (INDEPENDENT_AMBULATORY_CARE_PROVIDER_SITE_OTHER): Payer: Medicare Other | Admitting: Pharmacist Clinician (PhC)/ Clinical Pharmacy Specialist

## 2013-04-08 DIAGNOSIS — Z954 Presence of other heart-valve replacement: Secondary | ICD-10-CM

## 2013-04-08 DIAGNOSIS — Z7901 Long term (current) use of anticoagulants: Secondary | ICD-10-CM

## 2013-04-08 DIAGNOSIS — Z952 Presence of prosthetic heart valve: Secondary | ICD-10-CM

## 2013-05-14 ENCOUNTER — Ambulatory Visit (INDEPENDENT_AMBULATORY_CARE_PROVIDER_SITE_OTHER): Payer: Medicare Other | Admitting: Pharmacist Clinician (PhC)/ Clinical Pharmacy Specialist

## 2013-05-14 VITALS — BP 128/84 | HR 72

## 2013-05-14 DIAGNOSIS — Z7901 Long term (current) use of anticoagulants: Secondary | ICD-10-CM

## 2013-05-14 DIAGNOSIS — Z954 Presence of other heart-valve replacement: Secondary | ICD-10-CM

## 2013-05-14 DIAGNOSIS — Z952 Presence of prosthetic heart valve: Secondary | ICD-10-CM

## 2013-05-14 LAB — POCT INR: INR: 4.1

## 2013-05-14 MED ORDER — WARFARIN SODIUM 5 MG PO TABS: ORAL_TABLET | ORAL | Status: DC

## 2013-06-09 ENCOUNTER — Ambulatory Visit: Payer: Medicare Other | Admitting: Pharmacist Clinician (PhC)/ Clinical Pharmacy Specialist

## 2013-06-12 ENCOUNTER — Ambulatory Visit (INDEPENDENT_AMBULATORY_CARE_PROVIDER_SITE_OTHER): Payer: PRIVATE HEALTH INSURANCE | Admitting: Pharmacist Clinician (PhC)/ Clinical Pharmacy Specialist

## 2013-06-12 VITALS — BP 122/76 | HR 60

## 2013-06-12 DIAGNOSIS — Z7901 Long term (current) use of anticoagulants: Secondary | ICD-10-CM

## 2013-06-12 DIAGNOSIS — Z952 Presence of prosthetic heart valve: Secondary | ICD-10-CM

## 2013-06-12 DIAGNOSIS — Z954 Presence of other heart-valve replacement: Secondary | ICD-10-CM

## 2013-06-17 ENCOUNTER — Other Ambulatory Visit: Payer: Self-pay | Admitting: Cardiovascular Disease

## 2013-06-17 LAB — COMPREHENSIVE METABOLIC PANEL
Alkaline Phosphatase: 82 U/L (ref 39–117)
BUN: 15 mg/dL (ref 6–23)
Glucose, Bld: 105 mg/dL — ABNORMAL HIGH (ref 70–99)
Sodium: 136 mEq/L (ref 135–145)
Total Bilirubin: 0.7 mg/dL (ref 0.3–1.2)

## 2013-06-17 LAB — CBC WITH DIFFERENTIAL/PLATELET
Basophils Relative: 0 % (ref 0–1)
Eosinophils Absolute: 0.2 10*3/uL (ref 0.0–0.7)
Hemoglobin: 12 g/dL — ABNORMAL LOW (ref 13.0–17.0)
MCH: 24.1 pg — ABNORMAL LOW (ref 26.0–34.0)
MCHC: 33.1 g/dL (ref 30.0–36.0)
Monocytes Absolute: 0.6 10*3/uL (ref 0.1–1.0)
Monocytes Relative: 12 % (ref 3–12)
Neutrophils Relative %: 62 % (ref 43–77)
RDW: 16.3 % — ABNORMAL HIGH (ref 11.5–15.5)

## 2013-06-17 LAB — LIPID PANEL
HDL: 43 mg/dL (ref >39)
LDL Cholesterol: 94 mg/dL (ref 0–99)
Total CHOL/HDL Ratio: 3.6 Ratio
Triglycerides: 85 mg/dL (ref <150)
VLDL: 17 mg/dL (ref 0–40)

## 2013-06-20 ENCOUNTER — Encounter: Payer: Self-pay | Admitting: Cardiovascular Disease

## 2013-07-10 ENCOUNTER — Ambulatory Visit (INDEPENDENT_AMBULATORY_CARE_PROVIDER_SITE_OTHER): Payer: PRIVATE HEALTH INSURANCE | Admitting: Pharmacist Clinician (PhC)/ Clinical Pharmacy Specialist

## 2013-07-10 VITALS — BP 110/70 | HR 56

## 2013-07-10 DIAGNOSIS — Z954 Presence of other heart-valve replacement: Secondary | ICD-10-CM

## 2013-07-10 DIAGNOSIS — Z952 Presence of prosthetic heart valve: Secondary | ICD-10-CM

## 2013-07-10 DIAGNOSIS — Z7901 Long term (current) use of anticoagulants: Secondary | ICD-10-CM

## 2013-08-07 ENCOUNTER — Ambulatory Visit (INDEPENDENT_AMBULATORY_CARE_PROVIDER_SITE_OTHER): Payer: PRIVATE HEALTH INSURANCE | Admitting: Pharmacist Clinician (PhC)/ Clinical Pharmacy Specialist

## 2013-08-07 VITALS — BP 140/80 | HR 60

## 2013-08-07 DIAGNOSIS — Z952 Presence of prosthetic heart valve: Secondary | ICD-10-CM

## 2013-08-07 DIAGNOSIS — Z954 Presence of other heart-valve replacement: Secondary | ICD-10-CM

## 2013-08-07 DIAGNOSIS — Z7901 Long term (current) use of anticoagulants: Secondary | ICD-10-CM

## 2013-08-07 LAB — POCT INR: INR: 2.5

## 2013-08-21 ENCOUNTER — Other Ambulatory Visit: Payer: Self-pay | Admitting: *Deleted

## 2013-08-21 MED ORDER — TRIAMTERENE-HCTZ 37.5-25 MG PO TABS: 1.0000 | ORAL_TABLET | Freq: Every day | ORAL | Status: DC

## 2013-09-03 ENCOUNTER — Ambulatory Visit (INDEPENDENT_AMBULATORY_CARE_PROVIDER_SITE_OTHER): Payer: PRIVATE HEALTH INSURANCE | Admitting: *Deleted

## 2013-09-03 DIAGNOSIS — Z952 Presence of prosthetic heart valve: Secondary | ICD-10-CM

## 2013-09-03 DIAGNOSIS — Z954 Presence of other heart-valve replacement: Secondary | ICD-10-CM

## 2013-09-03 DIAGNOSIS — Z7901 Long term (current) use of anticoagulants: Secondary | ICD-10-CM

## 2013-09-03 LAB — POCT INR: INR: 1.4

## 2013-09-03 MED ORDER — WARFARIN SODIUM 5 MG PO TABS: ORAL_TABLET | ORAL | Status: DC

## 2013-09-04 ENCOUNTER — Encounter (HOSPITAL_COMMUNITY): Payer: Self-pay | Admitting: Emergency Medicine

## 2013-09-04 ENCOUNTER — Emergency Department (HOSPITAL_COMMUNITY)
Admission: EM | Admit: 2013-09-04 | Discharge: 2013-09-04 | Disposition: A | Discharge status: Home / Self Care | Payer: PRIVATE HEALTH INSURANCE | Attending: Emergency Medicine | Admitting: Emergency Medicine

## 2013-09-04 ENCOUNTER — Emergency Department (HOSPITAL_COMMUNITY): Payer: PRIVATE HEALTH INSURANCE

## 2013-09-04 DIAGNOSIS — I10 Essential (primary) hypertension: Secondary | ICD-10-CM | POA: Insufficient documentation

## 2013-09-04 DIAGNOSIS — M542 Cervicalgia: Secondary | ICD-10-CM | POA: Insufficient documentation

## 2013-09-04 DIAGNOSIS — Z79899 Other long term (current) drug therapy: Secondary | ICD-10-CM | POA: Insufficient documentation

## 2013-09-04 DIAGNOSIS — Z7901 Long term (current) use of anticoagulants: Secondary | ICD-10-CM | POA: Insufficient documentation

## 2013-09-04 DIAGNOSIS — R42 Dizziness and giddiness: Secondary | ICD-10-CM | POA: Insufficient documentation

## 2013-09-04 DIAGNOSIS — H538 Other visual disturbances: Secondary | ICD-10-CM | POA: Insufficient documentation

## 2013-09-04 DIAGNOSIS — R5381 Other malaise: Secondary | ICD-10-CM | POA: Insufficient documentation

## 2013-09-04 DIAGNOSIS — I2510 Atherosclerotic heart disease of native coronary artery without angina pectoris: Secondary | ICD-10-CM | POA: Insufficient documentation

## 2013-09-04 DIAGNOSIS — R51 Headache: Secondary | ICD-10-CM | POA: Insufficient documentation

## 2013-09-04 DIAGNOSIS — H5710 Ocular pain, unspecified eye: Secondary | ICD-10-CM | POA: Insufficient documentation

## 2013-09-04 DIAGNOSIS — R55 Syncope and collapse: Secondary | ICD-10-CM | POA: Insufficient documentation

## 2013-09-04 DIAGNOSIS — Z9861 Coronary angioplasty status: Secondary | ICD-10-CM | POA: Insufficient documentation

## 2013-09-04 LAB — CBC WITH DIFFERENTIAL/PLATELET
Basophils Relative: 0 % (ref 0–1)
Eosinophils Relative: 2 % (ref 0–5)
Hemoglobin: 12.7 g/dL — ABNORMAL LOW (ref 13.0–17.0)
Lymphocytes Relative: 20 % (ref 12–46)
Monocytes Relative: 9 % (ref 3–12)
Neutrophils Relative %: 69 % (ref 43–77)
Platelets: 183 10*3/uL (ref 150–400)
RBC: 5.11 MIL/uL (ref 4.22–5.81)
WBC: 6.8 10*3/uL (ref 4.0–10.5)

## 2013-09-04 LAB — BASIC METABOLIC PANEL
Calcium: 8.9 mg/dL (ref 8.4–10.5)
Chloride: 101 mEq/L (ref 96–112)
GFR calc Af Amer: 85 mL/min — ABNORMAL LOW (ref >90)
GFR calc non Af Amer: 74 mL/min — ABNORMAL LOW (ref >90)
Glucose, Bld: 95 mg/dL (ref 70–99)
Potassium: 3.7 mEq/L (ref 3.5–5.1)
Sodium: 135 mEq/L (ref 135–145)

## 2013-09-04 LAB — TROPONIN I: Troponin I: <0.3 ng/mL (ref <0.30)

## 2013-09-04 LAB — PROTIME-INR: Prothrombin Time: 18.2 seconds — ABNORMAL HIGH (ref 11.6–15.2)

## 2013-09-04 NOTE — ED Notes (Signed)
Pt refused to wait for discharge papers/instructions.  Ambulatory with steady gait at discharge.

## 2013-09-04 NOTE — ED Notes (Addendum)
Started having blurred vision yesterday w/pain in cervical spine x 1 month and dizziness with position changes and "feeling like I'm going to fall out" if sitting on occasion.  C/O "not being able to see".  C/O eyes hurting - cannot describe pain.  Pt. On Coumadin for prior CABG.  Went to clinic yesterday and told level was low.  Increased dose.

## 2013-09-04 NOTE — ED Provider Notes (Signed)
CSN: [id number]     Arrival date & time 09/04/13  1207 History   This chart was scribed for [name] Hutching, MD, by Yevette Edwards, ED Scribe. This patient was seen in room APA04/APA04 and the patient's care was started at 1:26 PM.  First MD Initiated Contact with Patient 09/04/13 1308     Chief Complaint  Patient presents with  . Headache  . vision changes   . Weakness   The history is provided by the patient and the spouse. No language interpreter was used.   HPI Comments: [name] is a 62 y.o. male, with a h/o HTN, who presents to the Emergency Department complaining of vision changes, accompanied by gradually-increasing eye pain rated as 8/10, both of which began yesterday. He also reports experiencing dizziness which also began yesterday, and he thinks the dizziness is associated with changing positions. The pt expresses concern that he may "fall out." He states that he has experienced intermittently instability while he was walking. The pt denies numbness or parethesia to his extremities. He also denies any chest pain or SOB.   He reports he has also experienced pain to his cervical spine for approximately two months.   The pt was assessed at a clinic yesterday and he was told that the dosage of coumadin he is taking for a prior CABG is too low. He had cardiac surgery in 2005.   The pt is on disability.   Past Medical History  Diagnosis Date  . Coronary artery disease   . Hypertension   . Poor historian    Past Surgical History  Procedure Laterality Date  . Exploration post operative open heart      open heart surgery  . Coronary angioplasty with stent placement    . Circumcision     History reviewed. No pertinent family history. History  Substance Use Topics  . Smoking status: Never Smoker   . Smokeless tobacco: Not on file  . Alcohol Use: No    Review of Systems  Eyes: Positive for pain and visual disturbance.  Respiratory: Negative for shortness of breath.    Cardiovascular: Negative for chest pain.  Musculoskeletal: Positive for neck pain.  Neurological: Positive for dizziness and weakness. Negative for numbness.  All other systems reviewed and are negative.    Allergies  Iohexol and Aspirin  Home Medications   Current Outpatient Rx  Name  Route  Sig  Dispense  Refill  . amLODipine (NORVASC) 5 MG tablet   Oral   Take 5 mg by mouth daily.         . carvedilol (COREG) 25 MG tablet   Oral   Take 25 mg by mouth daily.         . digoxin (LANOXIN) 0.125 MG tablet   Oral   Take 0.0625 mg by mouth daily.         . folic acid (FOLVITE) 1 MG tablet   Oral   Take 1 mg by mouth daily.         Marland Kitchen HYDROcodone-acetaminophen (NORCO) 10-325 MG per tablet   Oral   Take 1 tablet by mouth every 4 (four) hours as needed for pain.         . ramipril (ALTACE) 10 MG capsule   Oral   Take 30 mg by mouth daily.          . simvastatin (ZOCOR) 40 MG tablet   Oral   Take 40 mg by mouth at bedtime.         Marland Kitchen  tamsulosin (FLOMAX) 0.4 MG CAPS   Oral   Take 0.4 mg by mouth daily.         Marland Kitchen triamterene-hydrochlorothiazide (MAXZIDE-25) 37.5-25 MG per tablet   Oral   Take 1 tablet by mouth daily. Take 1 tablet by mouth 3 times per week   30 tablet   4   . warfarin (COUMADIN) 5 MG tablet      Take 1.5-2 tablets by mouth daily as directed   60 tablet   3    Triage Vitals: BP 165/92  Pulse 52  Temp(Src) 98.1 F (36.7 C) (Oral)  Resp 18  Ht 6\' 1"  (1.854 m)  Wt 283 lb (128.368 kg)  BMI 37.35 kg/m2  SpO2 100%  Physical Exam  Nursing note and vitals reviewed. Constitutional: He is oriented to person, place, and time. He appears well-developed and well-nourished.  HENT:  Head: Normocephalic and atraumatic.  Eyes: Conjunctivae and EOM are normal. Pupils are equal, round, and reactive to light.  Neck: Normal range of motion. Neck supple.  Mild tenderness to neck with palpation.   Cardiovascular: Normal rate, regular rhythm  and normal heart sounds.   Pulmonary/Chest: Effort normal and breath sounds normal.  Abdominal: Soft. Bowel sounds are normal.  Musculoskeletal: Normal range of motion.  Neurological: He is alert and oriented to person, place, and time. No cranial nerve deficit.  Skin: Skin is warm and dry.  Psychiatric: He has a normal mood and affect.    ED Course  Procedures (including critical care time)  DIAGNOSTIC STUDIES: Oxygen Saturation is 100% on room air, normal by my interpretation.    COORDINATION OF CARE:  1:33 PM- Discussed treatment plan with patient, and the patient agreed to the plan.   Labs Review Labs Reviewed  BASIC METABOLIC PANEL - Abnormal; Notable for the following:    GFR calc non Af Amer 74 (*)    GFR calc Af Amer 85 (*)    All other components within normal limits  CBC WITH DIFFERENTIAL - Abnormal; Notable for the following:    Hemoglobin 12.7 (*)    HCT 38.4 (*)    MCV 75.1 (*)    MCH 24.9 (*)    RDW 15.7 (*)    All other components within normal limits  PROTIME-INR - Abnormal; Notable for the following:    Prothrombin Time 18.2 (*)    INR 1.55 (*)    All other components within normal limits  TROPONIN I   Imaging Review No results found.  EKG Interpretation     Ventricular Rate:  58 PR Interval:  206 QRS Duration: 120 QT Interval:  468 QTC Calculation: 459 R Axis:   -14 Text Interpretation:  Sinus bradycardia with occasional Premature ventricular complexes Left ventricular hypertrophy with QRS widening and repolarization abnormality Abnormal ECG When compared with ECG of 20-Jun-2012 08:05, Premature ventricular complexes are now Present T wave inversion less evident in Inferior leads           Ct Head Wo Contrast  09/04/2013   CLINICAL DATA:  Headaches and weakness with severe neck pain for 2 months. No known injury.  EXAM: CT HEAD WITHOUT CONTRAST  CT CERVICAL SPINE WITHOUT CONTRAST  TECHNIQUE: Multidetector CT imaging of the head and  cervical spine was performed following the standard protocol without intravenous contrast. Multiplanar CT image reconstructions of the cervical spine were also generated.  COMPARISON:  None.  FINDINGS: CT HEAD FINDINGS  No intracranial hemorrhage.  No CT evidence of large acute  infarct.  No intracranial mass lesion noted on this unenhanced exam.  No hydrocephalus.  Calcified ectatic right internal carotid artery cavernous segment.  Orbital structures unremarkable.  Very mild mucosal thickening ethmoid sinus air cells.  Incidentally noted is a small subcutaneous lipoma left lateral supraorbital region.  CT CERVICAL SPINE FINDINGS  Examination is limited by artifact caused by patient's habitus.  Degenerative changes C1-2 and C1 clivus articulation.  Fusion C2-3.  C3-4 facet joint degenerative changes greater on the right. Minimal anterior slip of C3. Mild bulge. Mild to moderate right-sided and mild left-sided foraminal narrowing.  C4-5 mild facet joint degenerative changes greater on left. Mild bulge. Mild foraminal narrowing. Mild spinal stenosis with minimal cord contact.  No fracture noted.  Small calcification left palatine tonsil consistent prior inflammation.  Left thyroid 1 cm lesion can be followed by thyroid ultrasound.  IMPRESSION: Head CT:  No acute intracranial abnormality.  Very mild mucosal thickening ethmoid sinus air cells and inferior left maxillary sinus.  Cervical spine CT:  Examination is limited by artifact caused by patient's habitus.  C3-4 facet joint degenerative changes greater on the right. Minimal anterior slip of C3. Mild bulge. Mild to moderate right-sided and mild left-sided foraminal narrowing.  C4-5 mild facet joint degenerative changes greater on left. Mild bulge. Mild foraminal narrowing. Mild spinal stenosis with minimal cord contact.  Left thyroid 1 cm lesion can be followed by thyroid ultrasound.   Electronically Signed   By: Bridgett Larsson M.D.   On: 09/04/2013 15:34   Ct  Cervical Spine Wo Contrast  09/04/2013   CLINICAL DATA:  Headaches and weakness with severe neck pain for 2 months. No known injury.  EXAM: CT HEAD WITHOUT CONTRAST  CT CERVICAL SPINE WITHOUT CONTRAST  TECHNIQUE: Multidetector CT imaging of the head and cervical spine was performed following the standard protocol without intravenous contrast. Multiplanar CT image reconstructions of the cervical spine were also generated.  COMPARISON:  None.  FINDINGS: CT HEAD FINDINGS  No intracranial hemorrhage.  No CT evidence of large acute infarct.  No intracranial mass lesion noted on this unenhanced exam.  No hydrocephalus.  Calcified ectatic right internal carotid artery cavernous segment.  Orbital structures unremarkable.  Very mild mucosal thickening ethmoid sinus air cells.  Incidentally noted is a small subcutaneous lipoma left lateral supraorbital region.  CT CERVICAL SPINE FINDINGS  Examination is limited by artifact caused by patient's habitus.  Degenerative changes C1-2 and C1 clivus articulation.  Fusion C2-3.  C3-4 facet joint degenerative changes greater on the right. Minimal anterior slip of C3. Mild bulge. Mild to moderate right-sided and mild left-sided foraminal narrowing.  C4-5 mild facet joint degenerative changes greater on left. Mild bulge. Mild foraminal narrowing. Mild spinal stenosis with minimal cord contact.  No fracture noted.  Small calcification left palatine tonsil consistent prior inflammation.  Left thyroid 1 cm lesion can be followed by thyroid ultrasound.  IMPRESSION: Head CT:  No acute intracranial abnormality.  Very mild mucosal thickening ethmoid sinus air cells and inferior left maxillary sinus.  Cervical spine CT:  Examination is limited by artifact caused by patient's habitus.  C3-4 facet joint degenerative changes greater on the right. Minimal anterior slip of C3. Mild bulge. Mild to moderate right-sided and mild left-sided foraminal narrowing.  C4-5 mild facet joint degenerative  changes greater on left. Mild bulge. Mild foraminal narrowing. Mild spinal stenosis with minimal cord contact.  Left thyroid 1 cm lesion can be followed by thyroid ultrasound.   Electronically Signed  By: Bridgett Larsson M.D.   On: 09/04/2013 15:34    MDM  No diagnosis found. Patient alert, oriented, ambulatory. No syncopal spells noted. No neuro deficits. Screening labs, EKG, CT head, CT cervical spine show no acute findings. Patient advised to get followup for thyroid lesion.  I personally performed the services described in this documentation, which was scribed in my presence. The recorded information has been reviewed and is accurate.      [name] Hutching, MD 09/06/13 [phone]

## 2013-09-04 NOTE — Progress Notes (Signed)
ED/CM noted patient did not have health insurance and/or PCP listed in the computer.  Patient was given the [place] resource handout with information on the clinics, food pantries, and the handout for new health insurance sign-up.  Patient expressed appreciation for this. 

## 2013-09-10 ENCOUNTER — Ambulatory Visit (INDEPENDENT_AMBULATORY_CARE_PROVIDER_SITE_OTHER): Payer: PRIVATE HEALTH INSURANCE | Admitting: *Deleted

## 2013-09-10 DIAGNOSIS — Z954 Presence of other heart-valve replacement: Secondary | ICD-10-CM

## 2013-09-10 DIAGNOSIS — Z952 Presence of prosthetic heart valve: Secondary | ICD-10-CM

## 2013-09-10 DIAGNOSIS — Z7901 Long term (current) use of anticoagulants: Secondary | ICD-10-CM

## 2013-09-10 LAB — POCT INR: INR: 2.7

## 2013-09-15 ENCOUNTER — Other Ambulatory Visit: Payer: Self-pay | Admitting: *Deleted

## 2013-09-15 MED ORDER — TAMSULOSIN HCL 0.4 MG PO CAPS: 0.4000 mg | ORAL_CAPSULE | Freq: Every day | ORAL | Status: DC

## 2013-09-15 NOTE — Telephone Encounter (Signed)
Rx was sent to pharmacy electronically. 

## 2013-09-17 ENCOUNTER — Emergency Department (HOSPITAL_COMMUNITY)
Admission: EM | Admit: 2013-09-17 | Discharge: 2013-09-17 | Discharge status: Against Medical Advice | Payer: PRIVATE HEALTH INSURANCE | Attending: Emergency Medicine | Admitting: Emergency Medicine

## 2013-09-17 ENCOUNTER — Encounter (HOSPITAL_COMMUNITY): Payer: Self-pay | Admitting: Emergency Medicine

## 2013-09-17 DIAGNOSIS — W503XXA Accidental bite by another person, initial encounter: Secondary | ICD-10-CM | POA: Insufficient documentation

## 2013-09-17 DIAGNOSIS — I10 Essential (primary) hypertension: Secondary | ICD-10-CM | POA: Insufficient documentation

## 2013-09-17 DIAGNOSIS — Y9389 Activity, other specified: Secondary | ICD-10-CM | POA: Insufficient documentation

## 2013-09-17 DIAGNOSIS — I2510 Atherosclerotic heart disease of native coronary artery without angina pectoris: Secondary | ICD-10-CM | POA: Insufficient documentation

## 2013-09-17 DIAGNOSIS — Y929 Unspecified place or not applicable: Secondary | ICD-10-CM | POA: Insufficient documentation

## 2013-09-17 DIAGNOSIS — S01502A Unspecified open wound of oral cavity, initial encounter: Secondary | ICD-10-CM | POA: Insufficient documentation

## 2013-09-17 LAB — PROTIME-INR
INR: 2.95 — ABNORMAL HIGH (ref 0.00–1.49)
Prothrombin Time: 29.7 seconds — ABNORMAL HIGH (ref 11.6–15.2)

## 2013-09-17 LAB — BASIC METABOLIC PANEL
CO2: 21 mEq/L (ref 19–32)
Calcium: 8.8 mg/dL (ref 8.4–10.5)
Chloride: 104 mEq/L (ref 96–112)
Creatinine, Ser: 0.99 mg/dL (ref 0.50–1.35)
GFR calc Af Amer: >90 mL/min (ref >90)
Sodium: 135 mEq/L (ref 135–145)

## 2013-09-17 LAB — CBC WITH DIFFERENTIAL/PLATELET
Basophils Absolute: 0 10*3/uL (ref 0.0–0.1)
Basophils Relative: 0 % (ref 0–1)
Eosinophils Relative: 3 % (ref 0–5)
HCT: 36.5 % — ABNORMAL LOW (ref 39.0–52.0)
Lymphocytes Relative: 24 % (ref 12–46)
Lymphs Abs: 1.2 10*3/uL (ref 0.7–4.0)
MCHC: 33.4 g/dL (ref 30.0–36.0)
Monocytes Absolute: 0.3 10*3/uL (ref 0.1–1.0)
Monocytes Relative: 7 % (ref 3–12)
Neutro Abs: 3.4 10*3/uL (ref 1.7–7.7)
Platelets: 190 10*3/uL (ref 150–400)
RBC: 4.87 MIL/uL (ref 4.22–5.81)
RDW: 15.4 % (ref 11.5–15.5)
WBC: 5.1 10*3/uL (ref 4.0–10.5)

## 2013-09-17 NOTE — ED Notes (Signed)
Pt ambulated to the bathroom on my arrival, he is spitting saliva and blood in emesis bag, he removed the gauze that was acting as a pressure dressing.  Applied new gauze and asked him to leave in place and attempt to keep tiongue still (no speaking) to stop bleeding

## 2013-09-17 NOTE — ED Notes (Signed)
Pt bit tongue while chewing fatback around 7am today. Pt is on coumadin and is unable to get bleeding to stop.

## 2013-09-17 NOTE — ED Notes (Signed)
Pt reports bit tongue this am around 0715 and can't get the bleeding to stop.   Pt on coumadin.

## 2013-09-17 NOTE — ED Notes (Signed)
Patient is resting comfortably. 

## 2013-09-17 NOTE — ED Notes (Signed)
Family at bedside. 

## 2013-10-01 ENCOUNTER — Ambulatory Visit (INDEPENDENT_AMBULATORY_CARE_PROVIDER_SITE_OTHER): Payer: PRIVATE HEALTH INSURANCE | Admitting: *Deleted

## 2013-10-01 DIAGNOSIS — Z7901 Long term (current) use of anticoagulants: Secondary | ICD-10-CM

## 2013-10-01 DIAGNOSIS — Z952 Presence of prosthetic heart valve: Secondary | ICD-10-CM

## 2013-10-01 DIAGNOSIS — Z954 Presence of other heart-valve replacement: Secondary | ICD-10-CM

## 2013-10-13 ENCOUNTER — Other Ambulatory Visit: Payer: Self-pay | Admitting: *Deleted

## 2013-10-13 MED ORDER — RAMIPRIL 10 MG PO CAPS: 30.0000 mg | ORAL_CAPSULE | Freq: Every day | ORAL | Status: DC

## 2013-10-13 MED ORDER — CARVEDILOL 25 MG PO TABS: 25.0000 mg | ORAL_TABLET | Freq: Every day | ORAL | Status: DC

## 2013-10-13 NOTE — Telephone Encounter (Signed)
Rx was sent to pharmacy electronically. 

## 2013-10-20 ENCOUNTER — Other Ambulatory Visit: Payer: Self-pay | Admitting: *Deleted

## 2013-10-20 MED ORDER — TAMSULOSIN HCL 0.4 MG PO CAPS: 0.4000 mg | ORAL_CAPSULE | Freq: Every day | ORAL | Status: DC

## 2013-10-20 MED ORDER — SIMVASTATIN 40 MG PO TABS: 40.0000 mg | ORAL_TABLET | Freq: Every day | ORAL | Status: DC

## 2013-10-22 ENCOUNTER — Ambulatory Visit (INDEPENDENT_AMBULATORY_CARE_PROVIDER_SITE_OTHER): Payer: PRIVATE HEALTH INSURANCE | Admitting: Cardiovascular Disease

## 2013-10-22 ENCOUNTER — Encounter: Payer: Self-pay | Admitting: Cardiovascular Disease

## 2013-10-22 ENCOUNTER — Ambulatory Visit (INDEPENDENT_AMBULATORY_CARE_PROVIDER_SITE_OTHER): Payer: PRIVATE HEALTH INSURANCE | Admitting: *Deleted

## 2013-10-22 VITALS — BP 130/76 | HR 52 | Ht 73.0 in | Wt 290.0 lb

## 2013-10-22 DIAGNOSIS — Z952 Presence of prosthetic heart valve: Secondary | ICD-10-CM

## 2013-10-22 DIAGNOSIS — Z7901 Long term (current) use of anticoagulants: Secondary | ICD-10-CM

## 2013-10-22 DIAGNOSIS — E785 Hyperlipidemia, unspecified: Secondary | ICD-10-CM | POA: Insufficient documentation

## 2013-10-22 DIAGNOSIS — Z954 Presence of other heart-valve replacement: Secondary | ICD-10-CM

## 2013-10-22 DIAGNOSIS — I10 Essential (primary) hypertension: Secondary | ICD-10-CM

## 2013-10-22 DIAGNOSIS — Z9889 Other specified postprocedural states: Secondary | ICD-10-CM | POA: Insufficient documentation

## 2013-10-22 LAB — POCT INR: INR: 3.1

## 2013-10-22 NOTE — Progress Notes (Signed)
Patient ID: [name], male   DOB: [birth year], 62 y.o.   MRN: [id number]       CARDIOLOGY CONSULT NOTE  Patient ID: [name] MRN: [id number] DOB/AGE: 1951/02/25 61 y.o.  Admit date: (Not on file) Primary Physician No PCP Per Patient  Reason for Consultation: s/p AVR, aortic root repair, HTN, hyperlipidemia  HPI: This is a 62 year old patient I am meeting for the first time who has a past medical history significant for aortic valve replacement (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 He also has hypertension and hyperlipidemia. His cardiac catheterization prior to his surgery reportedly revealed normal coronary arteries with a dominant left circumflex coronary artery. He reportedly had an echocardiogram in January 2014 which showed normal left ventricular systolic function with an ejection fraction of 50-55%, a normally functioning prosthetic aortic valve with velocities of 2.5 m/s, and moderate left ventricular hypertrophy. He has been feeling well and denies chest pain, shortness of breath, palpitations and leg swelling. He had an episode of dizziness approximately one month ago after standing up too quickly but this has not recurred. He has no bleeding problems and takes warfarin.     Allergies  Allergen Reactions  . Iohexol Hives, Itching and Swelling    Extremity swelling and hives  . Aspirin     On coumadin    Current Outpatient Prescriptions  Medication Sig Dispense Refill  . amLODipine (NORVASC) 5 MG tablet Take 5 mg by mouth daily.      . carvedilol (COREG) 25 MG tablet Take 1 tablet (25 mg total) by mouth daily.  30 tablet  8  . digoxin (LANOXIN) 0.125 MG tablet Take 0.0625 mg by mouth daily.      . folic acid (FOLVITE) 1 MG tablet Take 1 mg by mouth daily.      Marland Kitchen HYDROcodone-acetaminophen (NORCO) 10-325 MG per tablet Take 1 tablet by mouth  every 4 (four) hours as needed for pain.      . ramipril (ALTACE) 10 MG capsule Take 10 mg by mouth daily.      . simvastatin (ZOCOR) 40 MG tablet Take 1 tablet (40 mg total) by mouth at bedtime.  30 tablet  6  . tamsulosin (FLOMAX) 0.4 MG CAPS capsule Take 1 capsule (0.4 mg total) by mouth daily.  30 capsule  1  . triamterene-hydrochlorothiazide (MAXZIDE-25) 37.5-25 MG per tablet Take 1 tablet by mouth 3 (three) times a week. Take 1 tablet by mouth 3 times per week      . warfarin (COUMADIN) 5 MG tablet Take 7.5-10 mg by mouth See admin instructions. Takes 10mg  on Monday, Wednesday and Friday.  Takes 7.5 mg on Sun, Tues, Thurs and Sat.       No current facility-administered medications for this visit.    Past Medical History  Diagnosis Date  . Coronary artery disease   . Hypertension   . Poor historian     Past Surgical History  Procedure Laterality Date  . Exploration post operative open heart      open heart surgery  . Coronary angioplasty with stent placement    . Circumcision      History   Social History  . Marital Status: Divorced    Spouse Name: N/A    Number of Children: N/A  . Years of Education: N/A   Occupational History  . Not on file.   Social History  Main Topics  . Smoking status: Never Smoker   . Smokeless tobacco: Not on file  . Alcohol Use: No  . Drug Use: Yes    Special: Marijuana     Comment: last use 1 month ago  . Sexual Activity: Yes    Birth Control/ Protection: None   Other Topics Concern  . Not on file   Social History Narrative  . No narrative on file     No family history on file.   Prior to Admission medications   Medication Sig Start Date End Date Taking? Authorizing Provider  amLODipine (NORVASC) 5 MG tablet Take 5 mg by mouth daily.   Yes Historical Provider, MD  carvedilol (COREG) 25 MG tablet Take 1 tablet (25 mg total) by mouth daily. 10/13/13  Yes Runell Gess, MD  digoxin (LANOXIN) 0.125 MG tablet Take 0.0625 mg by  mouth daily.   Yes Historical Provider, MD  folic acid (FOLVITE) 1 MG tablet Take 1 mg by mouth daily.   Yes Historical Provider, MD  HYDROcodone-acetaminophen (NORCO) 10-325 MG per tablet Take 1 tablet by mouth every 4 (four) hours as needed for pain.   Yes Historical Provider, MD  ramipril (ALTACE) 10 MG capsule Take 10 mg by mouth daily. 10/13/13  Yes Runell Gess, MD  simvastatin (ZOCOR) 40 MG tablet Take 1 tablet (40 mg total) by mouth at bedtime. 10/20/13  Yes Runell Gess, MD  tamsulosin (FLOMAX) 0.4 MG CAPS capsule Take 1 capsule (0.4 mg total) by mouth daily. 10/20/13  Yes Runell Gess, MD  triamterene-hydrochlorothiazide (MAXZIDE-25) 37.5-25 MG per tablet Take 1 tablet by mouth 3 (three) times a week. Take 1 tablet by mouth 3 times per week 08/21/13  Yes Runell Gess, MD  warfarin (COUMADIN) 5 MG tablet Take 7.5-10 mg by mouth See admin instructions. Takes 10mg  on Monday, Wednesday and Friday.  Takes 7.5 mg on Sun, Tues, Thurs and Sat. 09/03/13  Yes Laqueta Linden, MD     Review of systems complete and found to be negative unless listed above in HPI     Physical exam Blood pressure 130/76, pulse 52, height 6\' 1"  (1.854 m), weight 290 lb (131.543 kg). General: NAD Neck: No JVD, no thyromegaly or thyroid nodule.  Lungs: Clear to auscultation bilaterally with normal respiratory effort. CV: Nondisplaced PMI.  Heart regular S1/prosthetic S2 click, no S3/S4, I/VI systolic murmur best appreciated at RUSB.  No peripheral edema.  No carotid bruit.  Normal pedal pulses.  Abdomen: Soft, nontender, no hepatosplenomegaly, no distention.  Skin: Intact without lesions or rashes.  Neurologic: Alert and oriented x 3.  Psych: Normal affect. Extremities: No clubbing or cyanosis.  HEENT: Normal.   Labs:   Lab Results  Component Value Date   WBC 5.1 09/17/2013   HGB 12.2* 09/17/2013   HCT 36.5* 09/17/2013   MCV 74.9* 09/17/2013   PLT 190 09/17/2013   No results found for  this basename: NA, K, CL, CO2, BUN, CREATININE, CALCIUM, LABALBU, PROT, BILITOT, ALKPHOS, ALT, AST, GLUCOSE,  in the last 168 hours Lab Results  Component Value Date   TROPONINI <0.30 09/04/2013    Lab Results  Component Value Date   CHOL 154 06/17/2013   Lab Results  Component Value Date   HDL 43 06/17/2013   Lab Results  Component Value Date   LDLCALC 94 06/17/2013   Lab Results  Component Value Date   TRIG 85 06/17/2013   Lab Results  Component Value Date  CHOLHDL 3.6 06/17/2013   No results found for this basename: LDLDIRECT       EKG (09-04-13): Sinus rhythm, rate 58 bpm, LVH with repolarization abnormality, PVC  Studies: See HPI  ASSESSMENT AND PLAN:  1. S/p AVR and aortic root repair: normal AV velocities in 11/2012, with no diastolic murmur appreciated. Continue warfarin. 2. HTN: well controlled on present therapy. 3. Hyperlipidemia: taking simvastatin. Lipid panel in 05/2013: LDL 94, HDL 43, TG 85. No changes in therapy at this time.  Dispo: f/u in 1 year.  Signed: Prentice Docker, M.D., F.A.C.C.  10/22/2013, 10:10 AM

## 2013-10-22 NOTE — Patient Instructions (Addendum)
Your physician recommends that you schedule a follow-up appointment in:  One year    Your physician recommends that you continue on your current medications as directed. Please refer to the Current Medication list given to you today.

## 2013-11-11 ENCOUNTER — Other Ambulatory Visit: Payer: Self-pay | Admitting: *Deleted

## 2013-11-19 ENCOUNTER — Ambulatory Visit (INDEPENDENT_AMBULATORY_CARE_PROVIDER_SITE_OTHER): Payer: PRIVATE HEALTH INSURANCE | Admitting: *Deleted

## 2013-11-19 DIAGNOSIS — Z952 Presence of prosthetic heart valve: Secondary | ICD-10-CM

## 2013-11-19 DIAGNOSIS — Z954 Presence of other heart-valve replacement: Secondary | ICD-10-CM

## 2013-11-19 DIAGNOSIS — Z7901 Long term (current) use of anticoagulants: Secondary | ICD-10-CM

## 2013-12-17 ENCOUNTER — Ambulatory Visit (INDEPENDENT_AMBULATORY_CARE_PROVIDER_SITE_OTHER): Payer: PRIVATE HEALTH INSURANCE | Admitting: *Deleted

## 2013-12-17 ENCOUNTER — Encounter (INDEPENDENT_AMBULATORY_CARE_PROVIDER_SITE_OTHER): Payer: Self-pay

## 2013-12-17 DIAGNOSIS — Z954 Presence of other heart-valve replacement: Secondary | ICD-10-CM

## 2013-12-17 DIAGNOSIS — Z952 Presence of prosthetic heart valve: Secondary | ICD-10-CM

## 2013-12-17 DIAGNOSIS — Z7901 Long term (current) use of anticoagulants: Secondary | ICD-10-CM

## 2013-12-17 LAB — POCT INR: INR: 3

## 2013-12-23 ENCOUNTER — Telehealth: Payer: Self-pay | Admitting: Cardiology

## 2013-12-23 MED ORDER — DIGOXIN 125 MCG PO TABS: 0.0625 mg | ORAL_TABLET | Freq: Every day | ORAL | Status: DC

## 2013-12-23 MED ORDER — FOLIC ACID 1 MG PO TABS: 1.0000 mg | ORAL_TABLET | Freq: Every day | ORAL | Status: DC

## 2013-12-23 NOTE — Telephone Encounter (Signed)
Refills sent per request.

## 2013-12-23 NOTE — Telephone Encounter (Signed)
Received fax refill request  Rx # [id number]  Medication:  Digoxin 125 mcg tablet Qty 15 Sig:  Take 1/2 tablet by mouth once daily Physician:  Wyline Mood

## 2013-12-23 NOTE — Telephone Encounter (Signed)
Received fax refill request  Rx # [id number]  Medication:  Folic Acid 1 mg tablets Qty 30 Sig:  Take one tablet by mouth daily Physician:  Wyline Mood

## 2013-12-30 ENCOUNTER — Other Ambulatory Visit: Payer: Self-pay | Admitting: Cardiovascular Disease

## 2013-12-31 ENCOUNTER — Other Ambulatory Visit: Payer: Self-pay | Admitting: *Deleted

## 2013-12-31 MED ORDER — TAMSULOSIN HCL 0.4 MG PO CAPS: 0.4000 mg | ORAL_CAPSULE | Freq: Every day | ORAL | Status: DC

## 2014-01-27 ENCOUNTER — Telehealth: Payer: Self-pay | Admitting: *Deleted

## 2014-01-27 NOTE — Telephone Encounter (Signed)
Pt states that he was taking ramipril twice a day and last Rx was only called in for one a day. Pt is out of pills and pharmacy will not refill them. Please call [facility]

## 2014-01-27 NOTE — Telephone Encounter (Signed)
Patient has been taking Ramipril bid and not daily as directed Your physician recommends that you continue on your current medications as directed. Please refer to the Current Medication list given to you today. Spoke with Nate at [facility] will charge pt $18 to fill this rx this one time. LM on pt's home AM

## 2014-01-28 ENCOUNTER — Ambulatory Visit (INDEPENDENT_AMBULATORY_CARE_PROVIDER_SITE_OTHER): Payer: PRIVATE HEALTH INSURANCE | Admitting: *Deleted

## 2014-01-28 DIAGNOSIS — Z5181 Encounter for therapeutic drug level monitoring: Secondary | ICD-10-CM

## 2014-01-28 DIAGNOSIS — Z7901 Long term (current) use of anticoagulants: Secondary | ICD-10-CM

## 2014-01-28 DIAGNOSIS — Z952 Presence of prosthetic heart valve: Secondary | ICD-10-CM

## 2014-01-28 DIAGNOSIS — Z954 Presence of other heart-valve replacement: Secondary | ICD-10-CM

## 2014-01-28 LAB — POCT INR: INR: 1.8

## 2014-02-05 ENCOUNTER — Telehealth: Payer: Self-pay | Admitting: Cardiovascular Disease

## 2014-02-05 MED ORDER — TAMSULOSIN HCL 0.4 MG PO CAPS: 0.4000 mg | ORAL_CAPSULE | Freq: Every day | ORAL | Status: DC

## 2014-02-05 NOTE — Telephone Encounter (Signed)
Received fax refill request  Rx # [id number] Medication:  Tamsulosin 0.4 mg cap Qty 30 Sig:  Take one capsule by mouth at bedtime Physician:  Purvis Sheffield

## 2014-02-05 NOTE — Telephone Encounter (Signed)
Refill tamsulin 0.4 mg #90 with refill x 3

## 2014-02-25 ENCOUNTER — Ambulatory Visit (INDEPENDENT_AMBULATORY_CARE_PROVIDER_SITE_OTHER): Payer: PRIVATE HEALTH INSURANCE | Admitting: *Deleted

## 2014-02-25 DIAGNOSIS — Z5181 Encounter for therapeutic drug level monitoring: Secondary | ICD-10-CM

## 2014-02-25 DIAGNOSIS — Z952 Presence of prosthetic heart valve: Secondary | ICD-10-CM

## 2014-02-25 DIAGNOSIS — Z954 Presence of other heart-valve replacement: Secondary | ICD-10-CM

## 2014-02-25 DIAGNOSIS — Z7901 Long term (current) use of anticoagulants: Secondary | ICD-10-CM

## 2014-02-25 LAB — POCT INR: INR: 4.2

## 2014-02-27 ENCOUNTER — Other Ambulatory Visit: Payer: Self-pay | Admitting: Cardiovascular Disease

## 2014-03-18 ENCOUNTER — Ambulatory Visit (INDEPENDENT_AMBULATORY_CARE_PROVIDER_SITE_OTHER): Payer: PRIVATE HEALTH INSURANCE | Admitting: *Deleted

## 2014-03-18 DIAGNOSIS — Z952 Presence of prosthetic heart valve: Secondary | ICD-10-CM

## 2014-03-18 DIAGNOSIS — Z5181 Encounter for therapeutic drug level monitoring: Secondary | ICD-10-CM

## 2014-03-18 DIAGNOSIS — Z7901 Long term (current) use of anticoagulants: Secondary | ICD-10-CM

## 2014-03-18 DIAGNOSIS — Z954 Presence of other heart-valve replacement: Secondary | ICD-10-CM

## 2014-03-18 LAB — POCT INR: INR: 3

## 2014-04-24 ENCOUNTER — Telehealth: Payer: Self-pay | Admitting: Cardiovascular Disease

## 2014-04-24 MED ORDER — RAMIPRIL 10 MG PO CAPS: 10.0000 mg | ORAL_CAPSULE | Freq: Every day | ORAL | Status: DC

## 2014-04-24 NOTE — Telephone Encounter (Signed)
Medication sent to pharmacy  

## 2014-04-24 NOTE — Telephone Encounter (Signed)
Received fax refill request  Rx # [id number] Medication:  Ramipril 10 mg capsule Qty 30 Sig:  Take one capsule by mouth daily Physician:  Purvis Sheffield

## 2014-04-27 ENCOUNTER — Ambulatory Visit (INDEPENDENT_AMBULATORY_CARE_PROVIDER_SITE_OTHER): Payer: PRIVATE HEALTH INSURANCE | Admitting: *Deleted

## 2014-04-27 DIAGNOSIS — Z7901 Long term (current) use of anticoagulants: Secondary | ICD-10-CM

## 2014-04-27 DIAGNOSIS — Z954 Presence of other heart-valve replacement: Secondary | ICD-10-CM

## 2014-04-27 DIAGNOSIS — Z5181 Encounter for therapeutic drug level monitoring: Secondary | ICD-10-CM

## 2014-04-27 DIAGNOSIS — Z952 Presence of prosthetic heart valve: Secondary | ICD-10-CM

## 2014-04-27 LAB — POCT INR: INR: 1.9

## 2014-05-11 ENCOUNTER — Ambulatory Visit (INDEPENDENT_AMBULATORY_CARE_PROVIDER_SITE_OTHER): Payer: PRIVATE HEALTH INSURANCE | Admitting: *Deleted

## 2014-05-11 DIAGNOSIS — Z5181 Encounter for therapeutic drug level monitoring: Secondary | ICD-10-CM

## 2014-05-11 DIAGNOSIS — Z7901 Long term (current) use of anticoagulants: Secondary | ICD-10-CM

## 2014-05-11 DIAGNOSIS — Z952 Presence of prosthetic heart valve: Secondary | ICD-10-CM

## 2014-05-11 DIAGNOSIS — Z954 Presence of other heart-valve replacement: Secondary | ICD-10-CM

## 2014-05-11 LAB — POCT INR: INR: 3.9

## 2014-05-28 ENCOUNTER — Other Ambulatory Visit: Payer: Self-pay | Admitting: *Deleted

## 2014-05-28 MED ORDER — AMLODIPINE BESYLATE 5 MG PO TABS: 5.0000 mg | ORAL_TABLET | Freq: Every day | ORAL | Status: DC

## 2014-06-01 ENCOUNTER — Ambulatory Visit (INDEPENDENT_AMBULATORY_CARE_PROVIDER_SITE_OTHER): Payer: PRIVATE HEALTH INSURANCE | Admitting: *Deleted

## 2014-06-01 DIAGNOSIS — Z7901 Long term (current) use of anticoagulants: Secondary | ICD-10-CM

## 2014-06-01 DIAGNOSIS — Z954 Presence of other heart-valve replacement: Secondary | ICD-10-CM

## 2014-06-01 DIAGNOSIS — Z5181 Encounter for therapeutic drug level monitoring: Secondary | ICD-10-CM

## 2014-06-01 DIAGNOSIS — Z952 Presence of prosthetic heart valve: Secondary | ICD-10-CM

## 2014-06-01 LAB — POCT INR: INR: 2.9

## 2014-06-29 ENCOUNTER — Telehealth: Payer: Self-pay | Admitting: Cardiovascular Disease

## 2014-06-29 ENCOUNTER — Ambulatory Visit (INDEPENDENT_AMBULATORY_CARE_PROVIDER_SITE_OTHER): Payer: PRIVATE HEALTH INSURANCE | Admitting: *Deleted

## 2014-06-29 DIAGNOSIS — Z7901 Long term (current) use of anticoagulants: Secondary | ICD-10-CM

## 2014-06-29 DIAGNOSIS — Z952 Presence of prosthetic heart valve: Secondary | ICD-10-CM

## 2014-06-29 DIAGNOSIS — Z5181 Encounter for therapeutic drug level monitoring: Secondary | ICD-10-CM

## 2014-06-29 DIAGNOSIS — Z954 Presence of other heart-valve replacement: Secondary | ICD-10-CM

## 2014-06-29 LAB — POCT INR: INR: 4.3

## 2014-06-29 MED ORDER — FOLIC ACID 1 MG PO TABS: 1.0000 mg | ORAL_TABLET | Freq: Every day | ORAL | Status: DC

## 2014-06-29 MED ORDER — DIGOXIN 125 MCG PO TABS: 0.0625 mg | ORAL_TABLET | Freq: Every day | ORAL | Status: DC

## 2014-06-29 NOTE — Telephone Encounter (Signed)
Received fax refill request  Rx # [id number] Medication:  Digoxin 125 mcg tablets Qty 15 Sig:  Take 1/2 tablet by mouth once daily Physician:  Purvis Sheffield

## 2014-06-29 NOTE — Telephone Encounter (Signed)
Received fax refill request  Rx # [id number] Medication:  Folic Acid 1 mg tablets Qty 30 Sig:  Take one tablet by mouth daily Physician:  Purvis Sheffield  '

## 2014-06-30 ENCOUNTER — Telehealth: Payer: Self-pay | Admitting: Cardiovascular Disease

## 2014-06-30 MED ORDER — TRIAMTERENE-HCTZ 37.5-25 MG PO TABS: 1.0000 | ORAL_TABLET | ORAL | Status: DC

## 2014-06-30 NOTE — Telephone Encounter (Signed)
Medication sent via escribe.  

## 2014-06-30 NOTE — Telephone Encounter (Signed)
Received fax refill request  Rx # [id number] Medication:  Triamterene / HCTZ 37.5 / 25 mg Qty 15 Sig:  Take one tablet by mouth three times daily Physician:  Purvis Sheffield

## 2014-07-20 ENCOUNTER — Ambulatory Visit (INDEPENDENT_AMBULATORY_CARE_PROVIDER_SITE_OTHER): Payer: PRIVATE HEALTH INSURANCE | Admitting: *Deleted

## 2014-07-20 DIAGNOSIS — Z954 Presence of other heart-valve replacement: Secondary | ICD-10-CM

## 2014-07-20 DIAGNOSIS — Z7901 Long term (current) use of anticoagulants: Secondary | ICD-10-CM

## 2014-07-20 DIAGNOSIS — Z5181 Encounter for therapeutic drug level monitoring: Secondary | ICD-10-CM

## 2014-07-20 DIAGNOSIS — Z952 Presence of prosthetic heart valve: Secondary | ICD-10-CM

## 2014-07-20 LAB — POCT INR: INR: 2.9

## 2014-07-29 ENCOUNTER — Telehealth: Payer: Self-pay | Admitting: Cardiovascular Disease

## 2014-07-29 MED ORDER — SIMVASTATIN 40 MG PO TABS: 40.0000 mg | ORAL_TABLET | Freq: Every day | ORAL | Status: DC

## 2014-07-29 NOTE — Telephone Encounter (Signed)
Received fax refill request  Rx # [id number] Medication:  Simvastatin 40 mg tablet Qty 30  Sig:  Take one tablet by mouth daily at bedtime Physician:  Purvis Sheffield

## 2014-07-29 NOTE — Telephone Encounter (Signed)
Medication sent to pharmacy  

## 2014-08-12 ENCOUNTER — Other Ambulatory Visit: Payer: Self-pay | Admitting: Cardiovascular Disease

## 2014-08-17 ENCOUNTER — Ambulatory Visit (INDEPENDENT_AMBULATORY_CARE_PROVIDER_SITE_OTHER): Payer: PRIVATE HEALTH INSURANCE | Admitting: *Deleted

## 2014-08-17 DIAGNOSIS — Z952 Presence of prosthetic heart valve: Secondary | ICD-10-CM

## 2014-08-17 DIAGNOSIS — Z7901 Long term (current) use of anticoagulants: Secondary | ICD-10-CM

## 2014-08-17 DIAGNOSIS — Z954 Presence of other heart-valve replacement: Secondary | ICD-10-CM

## 2014-08-17 DIAGNOSIS — Z5181 Encounter for therapeutic drug level monitoring: Secondary | ICD-10-CM

## 2014-08-17 LAB — POCT INR: INR: 3

## 2014-08-31 ENCOUNTER — Telehealth: Payer: Self-pay | Admitting: Cardiovascular Disease

## 2014-08-31 MED ORDER — CARVEDILOL 25 MG PO TABS: 25.0000 mg | ORAL_TABLET | Freq: Every day | ORAL | Status: DC

## 2014-08-31 NOTE — Telephone Encounter (Signed)
Needs refill on Carvedilol sent to [facility] / [facility]

## 2014-09-01 ENCOUNTER — Telehealth: Payer: Self-pay | Admitting: Cardiovascular Disease

## 2014-09-01 NOTE — Telephone Encounter (Signed)
Refilled 08/31/14

## 2014-09-01 NOTE — Telephone Encounter (Signed)
Received fax refill request  Rx # [id number] Medication:  Carvedilol 25 mg tablet Qty 30 Sig:  Take one tablet by mouth once daily Physician:  Purvis Sheffield

## 2014-09-14 ENCOUNTER — Ambulatory Visit (INDEPENDENT_AMBULATORY_CARE_PROVIDER_SITE_OTHER): Payer: PRIVATE HEALTH INSURANCE | Admitting: *Deleted

## 2014-09-14 DIAGNOSIS — Z23 Encounter for immunization: Secondary | ICD-10-CM

## 2014-09-14 DIAGNOSIS — Z954 Presence of other heart-valve replacement: Secondary | ICD-10-CM

## 2014-09-14 DIAGNOSIS — Z7901 Long term (current) use of anticoagulants: Secondary | ICD-10-CM

## 2014-09-14 DIAGNOSIS — Z952 Presence of prosthetic heart valve: Secondary | ICD-10-CM

## 2014-09-14 DIAGNOSIS — Z5181 Encounter for therapeutic drug level monitoring: Secondary | ICD-10-CM

## 2014-09-14 LAB — POCT INR: INR: 3.2

## 2014-10-19 ENCOUNTER — Encounter: Payer: Self-pay | Admitting: *Deleted

## 2014-10-23 ENCOUNTER — Telehealth: Payer: Self-pay

## 2014-10-23 ENCOUNTER — Encounter: Payer: Self-pay | Admitting: Adult Health

## 2014-10-23 ENCOUNTER — Ambulatory Visit (INDEPENDENT_AMBULATORY_CARE_PROVIDER_SITE_OTHER): Payer: PRIVATE HEALTH INSURANCE | Admitting: Adult Health

## 2014-10-23 VITALS — BP 128/88 | HR 49 | Ht 73.0 in | Wt 283.0 lb

## 2014-10-23 DIAGNOSIS — M79601 Pain in right arm: Secondary | ICD-10-CM

## 2014-10-23 DIAGNOSIS — Z954 Presence of other heart-valve replacement: Secondary | ICD-10-CM

## 2014-10-23 DIAGNOSIS — E785 Hyperlipidemia, unspecified: Secondary | ICD-10-CM

## 2014-10-23 DIAGNOSIS — Z952 Presence of prosthetic heart valve: Secondary | ICD-10-CM

## 2014-10-23 DIAGNOSIS — I10 Essential (primary) hypertension: Secondary | ICD-10-CM

## 2014-10-23 MED ORDER — TRIAMTERENE-HCTZ 37.5-25 MG PO TABS: 1.0000 | ORAL_TABLET | ORAL | Status: DC

## 2014-10-23 MED ORDER — RAMIPRIL 10 MG PO CAPS: 10.0000 mg | ORAL_CAPSULE | Freq: Every day | ORAL | Status: DC

## 2014-10-23 MED ORDER — AMLODIPINE BESYLATE 5 MG PO TABS: 5.0000 mg | ORAL_TABLET | Freq: Every day | ORAL | Status: DC

## 2014-10-23 MED ORDER — DIGOXIN 125 MCG PO TABS: 0.0625 mg | ORAL_TABLET | Freq: Every day | ORAL | Status: DC

## 2014-10-23 MED ORDER — SIMVASTATIN 40 MG PO TABS
40.0000 mg | ORAL_TABLET | Freq: Every day | ORAL | Status: DC
Start: 2014-10-23 — End: 2015-05-23

## 2014-10-23 MED ORDER — CARVEDILOL 25 MG PO TABS: 25.0000 mg | ORAL_TABLET | Freq: Every day | ORAL | Status: DC

## 2014-10-23 NOTE — Patient Instructions (Addendum)
Your physician wants you to follow-up in: 1 year You will receive a reminder letter in the mail two months in advance. If you don't receive a letter, please call our office to schedule the follow-up appointment.   Your INR today is 2.0  This is too low because you have a mechanical valve in your heart  Please  Call us when you get home and tell us how much warfarin you are taking   [phone], my name is Cathey   Please get lab work FASTING (CBC,BMET,LFT's,LIPID's,PSA)     Use Tylenol extra strength 2 tablets every 6 hrs for left arm pain.You may also apply BenGay cream or Icy Hot to the area    Please call Dr.Fanta to schedula an apt for your primary care provider  [phone]

## 2014-10-23 NOTE — Telephone Encounter (Signed)
Per K.Lawrence NP, after reviewing Misty Stanley Reid's Warfarin schedule with patient, we have asked patient to take the following:  Today, Friday 12/4, Saturday 12/5, Sunday 12/6 take 10 mg each day and follow up with Misty Stanley on Monday, 12/7 at 11:30 am  Patient called back and could still not tell us what dose of Warfarin he was taking.I discussed with Ms.Lawrence and above recommendations were made.I told patient I would call him directly after speaking with Ms.lawrence and to stay beside phone.I called back and got voice mail and left instructions,pt then called back and I had him repeat the instructions to me.He stated he was glad message was on phone so he could listen to it again.  He understands to see lisa on Monday

## 2014-10-23 NOTE — Addendum Note (Signed)
Addended by: Marlyn Corporal A on: 10/23/2014 03:11 PM   Modules accepted: Orders

## 2014-10-23 NOTE — Progress Notes (Deleted)
Name: [name]    DOB: 02-16-1951  Age: 63 y.o.  MR#: [id number]       PCP:  No PCP Per Patient      Insurance: Payor: [employer] MEDICARE / Plan: Ollen Gross / Product Type: *No Product type* /   CC:    Chief Complaint  Patient presents with  . Aortic Stenosis    Aortic Valve Replacement St Jude  . Hypertension    VS Filed Vitals:   10/23/14 1307  BP: 128/88  Pulse: 49  Height: 6\' 1"  (1.854 m)  Weight: 283 lb (128.368 kg)    Weights Current Weight  10/23/14 283 lb (128.368 kg)  10/22/13 290 lb (131.543 kg)  09/17/13 285 lb (129.275 kg)    Blood Pressure  BP Readings from Last 3 Encounters:  10/23/14 128/88  10/22/13 130/76  09/17/13 154/97     Admit date:  (Not on file) Last encounter with RMR:  Visit date not found   Allergy Iohexol and Aspirin  Current Outpatient Prescriptions  Medication Sig Dispense Refill  . amLODipine (NORVASC) 5 MG tablet Take 1 tablet (5 mg total) by mouth daily. 30 tablet 6  . carvedilol (COREG) 25 MG tablet Take 1 tablet (25 mg total) by mouth daily. 30 tablet 3  . digoxin (LANOXIN) 0.125 MG tablet Take 0.5 tablets (0.0625 mg total) by mouth daily. 30 tablet 6  . folic acid (FOLVITE) 1 MG tablet Take 1 tablet (1 mg total) by mouth daily. 30 tablet 6  . HYDROcodone-acetaminophen (NORCO) 10-325 MG per tablet Take 1 tablet by mouth every 4 (four) hours as needed for pain.    . ramipril (ALTACE) 10 MG capsule Take 1 capsule (10 mg total) by mouth daily. Take 10 mg by mouth daily. 90 capsule 3  . simvastatin (ZOCOR) 40 MG tablet Take 1 tablet (40 mg total) by mouth at bedtime. 30 tablet 6  . tamsulosin (FLOMAX) 0.4 MG CAPS capsule Take 1 capsule (0.4 mg total) by mouth at bedtime. 90 capsule 3  . triamterene-hydrochlorothiazide (MAXZIDE-25) 37.5-25 MG per tablet Take 1 tablet by mouth 3 (three) times a week. Take 1 tablet by mouth 3 times per week 15 tablet 3  . warfarin (COUMADIN) 5 MG tablet TAKE 1 AND 1/2 TO 2 TABLETS BY MOUTH DAILY  AS DIRECTED. 60 tablet 3   No current facility-administered medications for this visit.    Discontinued Meds:   There are no discontinued medications.  Patient Active Problem List   Diagnosis Date Noted  . Encounter for therapeutic drug monitoring 01/28/2014  . H/O aortic root repair 10/22/2013  . HTN (hypertension) 10/22/2013  . Hyperlipidemia 10/22/2013  . Long term (current) use of anticoagulants 03/07/2013  . Aortic valve replaced 03/07/2013    LABS    Component Value Date/Time   NA 135 09/17/2013 1048   NA 135 09/04/2013 1416   NA 136 06/17/2013 0921   K 3.8 09/17/2013 1048   K 3.7 09/04/2013 1416   K 3.9 06/17/2013 0921   CL 104 09/17/2013 1048   CL 101 09/04/2013 1416   CL 105 06/17/2013 0921   CO2 21 09/17/2013 1048   CO2 24 09/04/2013 1416   CO2 22 06/17/2013 0921   GLUCOSE 124* 09/17/2013 1048   GLUCOSE 95 09/04/2013 1416   GLUCOSE 105* 06/17/2013 0921   BUN 22 09/17/2013 1048   BUN 17 09/04/2013 1416   BUN 15 06/17/2013 0921   CREATININE 0.99 09/17/2013 1048   CREATININE 1.07 09/04/2013 1416  CREATININE 1.20 06/17/2013 0921   CREATININE 1.09 06/20/2012 0823   CALCIUM 8.8 09/17/2013 1048   CALCIUM 8.9 09/04/2013 1416   CALCIUM 8.5 06/17/2013 0921   GFRNONAA 87* 09/17/2013 1048   GFRNONAA 74* 09/04/2013 1416   GFRNONAA 73* 06/20/2012 0823   GFRAA >90 09/17/2013 1048   GFRAA 85* 09/04/2013 1416   GFRAA 85* 06/20/2012 0823   CMP     Component Value Date/Time   NA 135 09/17/2013 1048   K 3.8 09/17/2013 1048   CL 104 09/17/2013 1048   CO2 21 09/17/2013 1048   GLUCOSE 124* 09/17/2013 1048   BUN 22 09/17/2013 1048   CREATININE 0.99 09/17/2013 1048   CREATININE 1.20 06/17/2013 0921   CALCIUM 8.8 09/17/2013 1048   PROT 7.3 06/17/2013 0921   ALBUMIN 4.0 06/17/2013 0921   AST 17 06/17/2013 0921   ALT 12 06/17/2013 0921   ALKPHOS 82 06/17/2013 0921   BILITOT 0.7 06/17/2013 0921   GFRNONAA 87* 09/17/2013 1048   GFRAA >90 09/17/2013 1048        Component Value Date/Time   WBC 5.1 09/17/2013 1048   WBC 6.8 09/04/2013 1416   WBC 4.8 06/17/2013 0921   HGB 12.2* 09/17/2013 1048   HGB 12.7* 09/04/2013 1416   HGB 12.0* 06/17/2013 0921   HCT 36.5* 09/17/2013 1048   HCT 38.4* 09/04/2013 1416   HCT 36.3* 06/17/2013 0921   MCV 74.9* 09/17/2013 1048   MCV 75.1* 09/04/2013 1416   MCV 72.9* 06/17/2013 0921    Lipid Panel     Component Value Date/Time   CHOL 154 06/17/2013 0921   TRIG 85 06/17/2013 0921   HDL 43 06/17/2013 0921   CHOLHDL 3.6 06/17/2013 0921   VLDL 17 06/17/2013 0921   LDLCALC 94 06/17/2013 0921    ABG No results found for: PHART, PCO2ART, PO2ART, HCO3, TCO2, ACIDBASEDEF, O2SAT   Lab Results  Component Value Date   TSH 1.266 06/17/2013   BNP (last 3 results) No results for input(s): PROBNP in the last 8760 hours. Cardiac Panel (last 3 results) No results for input(s): CKTOTAL, CKMB, TROPONINI, RELINDX in the last 72 hours.  Iron/TIBC/Ferritin/ %Sat No results found for: IRON, TIBC, FERRITIN, IRONPCTSAT   EKG Orders placed or performed during the hospital encounter of 09/04/13  . ED EKG  . ED EKG  . EKG 12-Lead  . EKG 12-Lead  . EKG     Prior Assessment and Plan Problem List as of 10/23/2014      Cardiovascular and Mediastinum   HTN (hypertension)     Other   Long term (current) use of anticoagulants   Aortic valve replaced   H/O aortic root repair   Hyperlipidemia   Encounter for therapeutic drug monitoring       Imaging: No results found.

## 2014-10-23 NOTE — Progress Notes (Signed)
    HPI: [name] is a 63 y/o patient of Dr.Koneswaran we are following for ongoing assessment and management of St. Jude mechanical AoV replacement, Aortic root repair, hypertension, and hyperlipidemia. He is here for one year follow up.   He has not been hospitalized, had any ER visits, new diagnosis or new allergies. He is complaining today of right deltoid pain after getting a flu shot in October. He is having pain with ROM.  He missed his coumadin appt last week. He is uncertain what doses of the coumadin he is taking.   Allergies  Allergen Reactions  . Iohexol Hives, Itching and Swelling    Extremity swelling and hives  . Aspirin     On coumadin    Current Outpatient Prescriptions  Medication Sig Dispense Refill  . amLODipine (NORVASC) 5 MG tablet Take 1 tablet (5 mg total) by mouth daily. 30 tablet 6  . carvedilol (COREG) 25 MG tablet Take 1 tablet (25 mg total) by mouth daily. 30 tablet 3  . digoxin (LANOXIN) 0.125 MG tablet Take 0.5 tablets (0.0625 mg total) by mouth daily. 30 tablet 6  . folic acid (FOLVITE) 1 MG tablet Take 1 tablet (1 mg total) by mouth daily. 30 tablet 6  . HYDROcodone-acetaminophen (NORCO) 10-325 MG per tablet Take 1 tablet by mouth every 4 (four) hours as needed for pain.    . ramipril (ALTACE) 10 MG capsule Take 1 capsule (10 mg total) by mouth daily. Take 10 mg by mouth daily. 90 capsule 3  . simvastatin (ZOCOR) 40 MG tablet Take 1 tablet (40 mg total) by mouth at bedtime. 30 tablet 6  . tamsulosin (FLOMAX) 0.4 MG CAPS capsule Take 1 capsule (0.4 mg total) by mouth at bedtime. 90 capsule 3  . triamterene-hydrochlorothiazide (MAXZIDE-25) 37.5-25 MG per tablet Take 1 tablet by mouth 3 (three) times a week. Take 1 tablet by mouth 3 times per week 15 tablet 3  . warfarin (COUMADIN) 5 MG tablet TAKE 1 AND 1/2 TO 2 TABLETS BY MOUTH DAILY AS DIRECTED. 60 tablet 3   No current facility-administered medications for this visit.    Past Medical History    Diagnosis Date  . Coronary artery disease   . Hypertension   . Poor historian     Past Surgical History  Procedure Laterality Date  . Exploration post operative open heart      open heart surgery  . Coronary angioplasty with stent placement    . Circumcision      ROS: Complete review of systems performed and found to be negative unless outlined above  PHYSICAL EXAM General: Well developed, well nourished, in no acute distress Head: Eyes PERRLA, No xanthomas.   Normal cephalic and atramatic  Lungs: Clear bilaterally to auscultation and percussion. Heart: HRRR S1 S2, with crisp click. Pulses are 2+ & equal.            No carotid bruit. No JVD.  No abdominal bruits. No femoral bruits. Abdomen: Bowel sounds are positive, abdomen soft and non-tender without masses or                  Hernia's noted. Msk:  Back normal, normal gait. Normal strength and tone for age. Extremities: No clubbing, cyanosis or edema.  DP +1 Neuro: Alert and oriented X 3. Psych:  Good affect, responds appropriately   EKG: Sinus bradycardia rate of 49 bpm. LVH  ASSESSMENT AND PLAN

## 2014-10-23 NOTE — Assessment & Plan Note (Addendum)
Aortic valve sounds are crisp and without murmur. He is not having symptoms of chest pain or DOE. I am concerned about his coumadin dosing as we checked his INR and found it to be sub-therapeutic at 2.0. We will need to increase the dose of coumadin but unsure of his current dose. He missed the last coumadin clinic visit. He thinks he is taking 2 5 mg tablets one day and 1.5 the next but is not sure.   He will call us when he gets home and let us know what dose he is on. He is to come back and see coumadin clinic on Wed of next week for recheck of his status. I have spoken at length with him concerning the need to take coumadin as directed and to know his doses. I have warned him about the strong potential for CVA if he remains supra-therapeutic on coumadin dosing.  These instructions and warnings were also reinterated by Marlyn Corporal RN, who be receiving call from patient when he returns home.

## 2014-10-23 NOTE — Assessment & Plan Note (Signed)
BP is well controlled. He is slightly bradycardic on carvedilol 25 mg "daily" . May need to consider decreasing this dose. Will check CBC, BMET, Fasting lipids and LFT;s. PSA. He is given the name of Dr. Felecia Shelling to be established with PCP.

## 2014-10-23 NOTE — Assessment & Plan Note (Signed)
Labs are ordered 

## 2014-10-23 NOTE — Assessment & Plan Note (Signed)
He had a flu shot in October and is complaining of continued deltoid muscle pain with range of motion. I have moved his arm and there is no stiffness but he is sore with movement. I have advised him to take ES Tylenol 2 tablets every 6 hours prn pain, move the arm to prevent stiffness. Can use topical ointment like Crista Elliot or [employer] for comfort.

## 2014-10-26 ENCOUNTER — Ambulatory Visit (INDEPENDENT_AMBULATORY_CARE_PROVIDER_SITE_OTHER): Payer: PRIVATE HEALTH INSURANCE | Admitting: *Deleted

## 2014-10-26 DIAGNOSIS — Z952 Presence of prosthetic heart valve: Secondary | ICD-10-CM

## 2014-10-26 DIAGNOSIS — Z5181 Encounter for therapeutic drug level monitoring: Secondary | ICD-10-CM

## 2014-10-26 DIAGNOSIS — Z7901 Long term (current) use of anticoagulants: Secondary | ICD-10-CM

## 2014-10-26 DIAGNOSIS — Z954 Presence of other heart-valve replacement: Secondary | ICD-10-CM

## 2014-10-26 LAB — POCT INR: INR: 2.7

## 2014-10-28 LAB — LIPID PANEL
Cholesterol: 158 mg/dL (ref 0–200)
HDL: 41 mg/dL (ref >39)
LDL CALC: 89 mg/dL (ref 0–99)
TRIGLYCERIDES: 141 mg/dL (ref <150)
Total CHOL/HDL Ratio: 3.9 Ratio
VLDL: 28 mg/dL (ref 0–40)

## 2014-10-28 LAB — BASIC METABOLIC PANEL
BUN: 19 mg/dL (ref 6–23)
CALCIUM: 8.9 mg/dL (ref 8.4–10.5)
CHLORIDE: 107 meq/L (ref 96–112)
CO2: 26 meq/L (ref 19–32)
CREATININE: 0.98 mg/dL (ref 0.50–1.35)
Glucose, Bld: 99 mg/dL (ref 70–99)
POTASSIUM: 4.1 meq/L (ref 3.5–5.3)
Sodium: 138 mEq/L (ref 135–145)

## 2014-10-28 LAB — HEPATIC FUNCTION PANEL
ALBUMIN: 3.9 g/dL (ref 3.5–5.2)
ALT: 10 U/L (ref 0–53)
AST: 16 U/L (ref 0–37)
Alkaline Phosphatase: 86 U/L (ref 39–117)
BILIRUBIN DIRECT: 0.1 mg/dL (ref 0.0–0.3)
BILIRUBIN INDIRECT: 0.5 mg/dL (ref 0.2–1.2)
BILIRUBIN TOTAL: 0.6 mg/dL (ref 0.2–1.2)
Total Protein: 7.5 g/dL (ref 6.0–8.3)

## 2014-10-28 LAB — CBC
HEMATOCRIT: 36.8 % — AB (ref 39.0–52.0)
HEMOGLOBIN: 11.8 g/dL — AB (ref 13.0–17.0)
MCH: 23.6 pg — AB (ref 26.0–34.0)
MCHC: 32.1 g/dL (ref 30.0–36.0)
MCV: 73.7 fL — AB (ref 78.0–100.0)
MPV: 11.2 fL (ref 9.4–12.4)
Platelets: 222 10*3/uL (ref 150–400)
RBC: 4.99 MIL/uL (ref 4.22–5.81)
RDW: 16 % — ABNORMAL HIGH (ref 11.5–15.5)
WBC: 5.5 10*3/uL (ref 4.0–10.5)

## 2014-10-28 LAB — PSA: PSA: 1.61 ng/mL (ref <=4.00)

## 2014-11-16 ENCOUNTER — Ambulatory Visit (INDEPENDENT_AMBULATORY_CARE_PROVIDER_SITE_OTHER): Payer: PRIVATE HEALTH INSURANCE | Admitting: *Deleted

## 2014-11-16 DIAGNOSIS — Z954 Presence of other heart-valve replacement: Secondary | ICD-10-CM

## 2014-11-16 DIAGNOSIS — Z5181 Encounter for therapeutic drug level monitoring: Secondary | ICD-10-CM

## 2014-11-16 DIAGNOSIS — Z952 Presence of prosthetic heart valve: Secondary | ICD-10-CM

## 2014-11-16 LAB — POCT INR: INR: 3.1

## 2014-12-16 DIAGNOSIS — I519 Heart disease, unspecified: Secondary | ICD-10-CM | POA: Diagnosis not present

## 2014-12-16 DIAGNOSIS — I10 Essential (primary) hypertension: Secondary | ICD-10-CM | POA: Diagnosis not present

## 2014-12-16 DIAGNOSIS — M19011 Primary osteoarthritis, right shoulder: Secondary | ICD-10-CM | POA: Diagnosis not present

## 2014-12-16 DIAGNOSIS — M25511 Pain in right shoulder: Secondary | ICD-10-CM | POA: Diagnosis not present

## 2014-12-16 DIAGNOSIS — Z79899 Other long term (current) drug therapy: Secondary | ICD-10-CM | POA: Diagnosis not present

## 2014-12-16 DIAGNOSIS — Z952 Presence of prosthetic heart valve: Secondary | ICD-10-CM | POA: Diagnosis not present

## 2014-12-16 DIAGNOSIS — Z7901 Long term (current) use of anticoagulants: Secondary | ICD-10-CM | POA: Diagnosis not present

## 2014-12-26 ENCOUNTER — Other Ambulatory Visit: Payer: Self-pay | Admitting: Cardiovascular Disease

## 2014-12-28 ENCOUNTER — Ambulatory Visit (INDEPENDENT_AMBULATORY_CARE_PROVIDER_SITE_OTHER): Payer: Medicare Other | Admitting: *Deleted

## 2014-12-28 DIAGNOSIS — Z952 Presence of prosthetic heart valve: Secondary | ICD-10-CM

## 2014-12-28 DIAGNOSIS — Z5181 Encounter for therapeutic drug level monitoring: Secondary | ICD-10-CM

## 2014-12-28 DIAGNOSIS — Z7901 Long term (current) use of anticoagulants: Secondary | ICD-10-CM | POA: Diagnosis not present

## 2014-12-28 DIAGNOSIS — Z954 Presence of other heart-valve replacement: Secondary | ICD-10-CM | POA: Diagnosis not present

## 2014-12-28 LAB — POCT INR: INR: 3.3

## 2015-01-19 DIAGNOSIS — M25512 Pain in left shoulder: Secondary | ICD-10-CM | POA: Diagnosis not present

## 2015-01-19 DIAGNOSIS — T148 Other injury of unspecified body region: Secondary | ICD-10-CM | POA: Diagnosis not present

## 2015-01-19 DIAGNOSIS — S01552A Open bite of oral cavity, initial encounter: Secondary | ICD-10-CM | POA: Diagnosis not present

## 2015-01-19 DIAGNOSIS — M75102 Unspecified rotator cuff tear or rupture of left shoulder, not specified as traumatic: Secondary | ICD-10-CM | POA: Diagnosis not present

## 2015-01-19 DIAGNOSIS — S4992XA Unspecified injury of left shoulder and upper arm, initial encounter: Secondary | ICD-10-CM | POA: Diagnosis not present

## 2015-01-19 DIAGNOSIS — I772 Rupture of artery: Secondary | ICD-10-CM | POA: Diagnosis not present

## 2015-01-19 DIAGNOSIS — I10 Essential (primary) hypertension: Secondary | ICD-10-CM | POA: Diagnosis not present

## 2015-01-26 DIAGNOSIS — Z4802 Encounter for removal of sutures: Secondary | ICD-10-CM | POA: Diagnosis not present

## 2015-02-08 ENCOUNTER — Ambulatory Visit (INDEPENDENT_AMBULATORY_CARE_PROVIDER_SITE_OTHER): Payer: Medicare Other | Admitting: *Deleted

## 2015-02-08 DIAGNOSIS — Z954 Presence of other heart-valve replacement: Secondary | ICD-10-CM

## 2015-02-08 DIAGNOSIS — Z5181 Encounter for therapeutic drug level monitoring: Secondary | ICD-10-CM | POA: Diagnosis not present

## 2015-02-08 DIAGNOSIS — Z7901 Long term (current) use of anticoagulants: Secondary | ICD-10-CM

## 2015-02-08 DIAGNOSIS — Z952 Presence of prosthetic heart valve: Secondary | ICD-10-CM

## 2015-02-08 LAB — POCT INR: INR: 3.6

## 2015-03-01 ENCOUNTER — Other Ambulatory Visit: Payer: Self-pay | Admitting: Cardiovascular Disease

## 2015-03-08 ENCOUNTER — Ambulatory Visit (INDEPENDENT_AMBULATORY_CARE_PROVIDER_SITE_OTHER): Payer: Medicare Other | Admitting: *Deleted

## 2015-03-08 DIAGNOSIS — Z7901 Long term (current) use of anticoagulants: Secondary | ICD-10-CM | POA: Diagnosis not present

## 2015-03-08 DIAGNOSIS — Z952 Presence of prosthetic heart valve: Secondary | ICD-10-CM

## 2015-03-08 DIAGNOSIS — Z5181 Encounter for therapeutic drug level monitoring: Secondary | ICD-10-CM | POA: Diagnosis not present

## 2015-03-08 DIAGNOSIS — Z954 Presence of other heart-valve replacement: Secondary | ICD-10-CM

## 2015-03-08 LAB — POCT INR: INR: 4.1

## 2015-03-29 ENCOUNTER — Other Ambulatory Visit: Payer: Self-pay | Admitting: Adult Health

## 2015-03-29 ENCOUNTER — Ambulatory Visit (INDEPENDENT_AMBULATORY_CARE_PROVIDER_SITE_OTHER): Payer: Medicare Other | Admitting: *Deleted

## 2015-03-29 DIAGNOSIS — Z954 Presence of other heart-valve replacement: Secondary | ICD-10-CM

## 2015-03-29 DIAGNOSIS — Z7901 Long term (current) use of anticoagulants: Secondary | ICD-10-CM | POA: Diagnosis not present

## 2015-03-29 DIAGNOSIS — Z952 Presence of prosthetic heart valve: Secondary | ICD-10-CM

## 2015-03-29 DIAGNOSIS — Z5181 Encounter for therapeutic drug level monitoring: Secondary | ICD-10-CM | POA: Diagnosis not present

## 2015-03-29 LAB — POCT INR: INR: 3.6

## 2015-03-29 MED ORDER — CARVEDILOL 25 MG PO TABS: 25.0000 mg | ORAL_TABLET | Freq: Every day | ORAL | Status: DC

## 2015-04-26 ENCOUNTER — Ambulatory Visit (INDEPENDENT_AMBULATORY_CARE_PROVIDER_SITE_OTHER): Payer: Medicare Other | Admitting: *Deleted

## 2015-04-26 DIAGNOSIS — Z7901 Long term (current) use of anticoagulants: Secondary | ICD-10-CM | POA: Diagnosis not present

## 2015-04-26 DIAGNOSIS — Z5181 Encounter for therapeutic drug level monitoring: Secondary | ICD-10-CM

## 2015-04-26 DIAGNOSIS — Z954 Presence of other heart-valve replacement: Secondary | ICD-10-CM

## 2015-04-26 DIAGNOSIS — Z952 Presence of prosthetic heart valve: Secondary | ICD-10-CM

## 2015-04-26 LAB — POCT INR: INR: 3.1

## 2015-05-14 DIAGNOSIS — M25511 Pain in right shoulder: Secondary | ICD-10-CM | POA: Diagnosis not present

## 2015-05-14 DIAGNOSIS — M25512 Pain in left shoulder: Secondary | ICD-10-CM | POA: Diagnosis not present

## 2015-05-14 DIAGNOSIS — I2510 Atherosclerotic heart disease of native coronary artery without angina pectoris: Secondary | ICD-10-CM | POA: Diagnosis not present

## 2015-05-14 DIAGNOSIS — R5383 Other fatigue: Secondary | ICD-10-CM | POA: Diagnosis not present

## 2015-05-14 DIAGNOSIS — Z79899 Other long term (current) drug therapy: Secondary | ICD-10-CM | POA: Diagnosis not present

## 2015-05-14 DIAGNOSIS — G729 Myopathy, unspecified: Secondary | ICD-10-CM | POA: Diagnosis not present

## 2015-05-23 ENCOUNTER — Other Ambulatory Visit: Payer: Self-pay | Admitting: Adult Health

## 2015-05-23 ENCOUNTER — Other Ambulatory Visit: Payer: Self-pay | Admitting: Cardiovascular Disease

## 2015-05-25 ENCOUNTER — Other Ambulatory Visit: Payer: Self-pay | Admitting: *Deleted

## 2015-05-25 MED ORDER — WARFARIN SODIUM 5 MG PO TABS: ORAL_TABLET | ORAL | Status: DC

## 2015-05-31 ENCOUNTER — Ambulatory Visit (INDEPENDENT_AMBULATORY_CARE_PROVIDER_SITE_OTHER): Payer: Medicare Other | Admitting: *Deleted

## 2015-05-31 DIAGNOSIS — Z5181 Encounter for therapeutic drug level monitoring: Secondary | ICD-10-CM

## 2015-05-31 DIAGNOSIS — Z952 Presence of prosthetic heart valve: Secondary | ICD-10-CM

## 2015-05-31 DIAGNOSIS — Z7901 Long term (current) use of anticoagulants: Secondary | ICD-10-CM

## 2015-05-31 DIAGNOSIS — Z954 Presence of other heart-valve replacement: Secondary | ICD-10-CM | POA: Diagnosis not present

## 2015-05-31 LAB — POCT INR: INR: 3.2

## 2015-06-07 ENCOUNTER — Encounter: Payer: Self-pay | Admitting: Cardiovascular Disease

## 2015-06-08 ENCOUNTER — Encounter: Payer: Self-pay | Admitting: Orthopedic Surgery

## 2015-06-08 ENCOUNTER — Ambulatory Visit (INDEPENDENT_AMBULATORY_CARE_PROVIDER_SITE_OTHER): Payer: Medicare Other | Admitting: Orthopedic Surgery

## 2015-06-08 VITALS — BP 153/92 | Ht 73.0 in | Wt 283.0 lb

## 2015-06-08 DIAGNOSIS — M75102 Unspecified rotator cuff tear or rupture of left shoulder, not specified as traumatic: Secondary | ICD-10-CM | POA: Diagnosis not present

## 2015-06-08 DIAGNOSIS — M75101 Unspecified rotator cuff tear or rupture of right shoulder, not specified as traumatic: Secondary | ICD-10-CM

## 2015-06-08 NOTE — Progress Notes (Addendum)
Patient ID: [name], male   DOB: [birth year], 64 y.o.   MRN: [id number]   Chief Complaint  Patient presents with  . Shoulder Pain    Bilateral shoulder pain, referred by [facility].    Darean Limas is a 64 y.o. male.   HPI 64 year old male was storing up football had some right shoulder pain then he developed left shoulder pain now presents with bilateral shoulder pain left worse than right associated with popping pain with pressure and lying on his left side pain with forward elevation in both shoulders no history of any major trauma  Evaluation at [facility] x-rays were negative  I don't have those he didn't bring them and didn't tell us that he had them we will get them later  Since there is no history of trauma I can proceed and get the x-ray reports later  He reported them as normal except joitn pain and stiff joints  Review of Systems See hpi  Past Medical History  Diagnosis Date  . Coronary artery disease   . Hypertension   . Poor historian   . Sexual dysfunction     Allergies  Allergen Reactions  . Iohexol Hives, Itching and Swelling    Extremity swelling and hives  . Aspirin     On coumadin  . Contrast Media [Iodinated Diagnostic Agents]     Current Outpatient Prescriptions  Medication Sig Dispense Refill  . warfarin (COUMADIN) 5 MG tablet Take 1 1/2 tablets daily 60 tablet 3  . amLODipine (NORVASC) 5 MG tablet TAKE ONE TABLET BY MOUTH DAILY. 30 tablet 3  . carvedilol (COREG) 25 MG tablet Take 1 tablet (25 mg total) by mouth daily. 30 tablet 6  . digoxin (LANOXIN) 0.125 MG tablet Take 0.5 tablets (0.0625 mg total) by mouth daily. 30 tablet 6  . folic acid (FOLVITE) 1 MG tablet TAKE ONE TABLET BY MOUTH DAILY. 30 tablet 6  . HYDROcodone-acetaminophen (NORCO) 10-325 MG per tablet Take 1 tablet by mouth every 4 (four) hours as needed for pain.    . ramipril (ALTACE) 10 MG capsule Take 1 capsule (10 mg total) by mouth daily. Take 10 mg by  mouth daily. 90 capsule 3  . simvastatin (ZOCOR) 40 MG tablet TAKE ONE TABLET BY MOUTH DAILY AT BEDTIME. 30 tablet 3  . tamsulosin (FLOMAX) 0.4 MG CAPS capsule TAKE ONE CAPSULE BY MOUTH AT BEDTIME. 90 capsule 3  . triamterene-hydrochlorothiazide (MAXZIDE-25) 37.5-25 MG per tablet TAKE ONE TABLET BY MOUTH THREE TIMES WEEKLY. 15 tablet 3   No current facility-administered medications for this visit.     Physical Exam Blood pressure 153/92, height 6\' 1"  (1.854 m), weight 283 lb (128.368 kg). Physical Exam  The patient is well developed well nourished and well groomed.   Orientation to person place and time is normal   Mood is pleasant.  Ambulatory status normal without assistive device  Right shoulder  Inspection: Mild tenderness in the peri-acromial region  ROM: Flexion is 130  Stability: Tests were normal  Motor exam: Rotator cuff strength was normal  Skin: Normal  Neuro: Normal sensation in the hand  Vascular: Normal pulse in the hand  Lymph: Normal axillary lymph nodes  Left shoulder  Acromial tenderness with crepitance on range of motion  Range of motion 130 as well  Stability tests were normal  Motor exam was normal  Skin was normal  Neurologic exam normal  Pulse exam normal  Lymph node exam normal Bilateral impingement signs at 120  Data Reviewed Order xrays no Read xrays  no Reports reviewed: Review reports when available  Diagnosis New with further work up no New without further work up yes  Bilateral rotator cuff syndrome without rotator cuff tear   Management Patient has not had any treatment. Recommend subacromial injections and physical therapy. He has coronary artery disease is on Coumadin and is unlikely surgical candidate  Follow-up 3 months we injected both shoulders  Procedure note the subacromial injection shoulder left   Verbal consent was obtained to inject the  Left   Shoulder  Timeout was completed to confirm the  injection site is a subacromial space of the  left  shoulder  Medication used Depo-Medrol 40 mg and lidocaine 1% 3 cc  Anesthesia was provided by ethyl chloride  The injection was performed in the left  posterior subacromial space. After pinning the skin with alcohol and anesthetized the skin with ethyl chloride the subacromial space was injected using a 20-gauge needle. There were no complications  Sterile dressing was applied.   Procedure note the subacromial injection shoulder RIGHT  Verbal consent was obtained to inject the  RIGHT   Shoulder  Timeout was completed to confirm the injection site is a subacromial space of the  RIGHT  shoulder   Medication used Depo-Medrol 40 mg and lidocaine 1% 3 cc  Anesthesia was provided by ethyl chloride  The injection was performed in the RIGHT  posterior subacromial space. After pinning the skin with alcohol and anesthetized the skin with ethyl chloride the subacromial space was injected using a 20-gauge needle. There were no complications  Sterile dressing was applied.

## 2015-06-08 NOTE — Patient Instructions (Addendum)
Call [facility] to schedule your therapy visits    Joint Injection Care After Refer to this sheet in the next few days. These instructions provide you with information on caring for yourself after you have had a joint injection. Your caregiver also may give you more specific instructions. Your treatment has been planned according to current medical practices, but problems sometimes occur. Call your caregiver if you have any problems or questions after your procedure. After any type of joint injection, it is not uncommon to experience:  Soreness, swelling, or bruising around the injection site.  Mild numbness, tingling, or weakness around the injection site caused by the numbing medicine used before or with the injection. It also is possible to experience the following effects associated with the specific agent after injection:  Iodine-based contrast agents:  Allergic reaction (itching, hives, widespread redness, and swelling beyond the injection site).  Corticosteroids (These effects are rare.):  Allergic reaction.  Increased blood sugar levels (If you have diabetes and you notice that your blood sugar levels have increased, notify your caregiver).  Increased blood pressure levels.  Mood swings.  Hyaluronic acid in the use of viscosupplementation.  Temporary heat or redness.  Temporary rash and itching.  Increased fluid accumulation in the injected joint. These effects all should resolve within a day after your procedure.  HOME CARE INSTRUCTIONS  Limit yourself to light activity the day of your procedure. Avoid lifting heavy objects, bending, stooping, or twisting.  Take prescription or over-the-counter pain medication as directed by your caregiver.  You may apply ice to your injection site to reduce pain and swelling the day of your procedure. Ice may be applied 03-04 times:  Put ice in a plastic bag.  Place a towel between your skin and the  bag.  Leave the ice on for no longer than 15-20 minutes each time. SEEK IMMEDIATE MEDICAL CARE IF:   Pain and swelling get worse rather than better or extend beyond the injection site.  Numbness does not go away.  Blood or fluid continues to leak from the injection site.  You have chest pain.  You have swelling of your face or tongue.  You have trouble breathing or you become dizzy.  You develop a fever, chills, or severe tenderness at the injection site that last longer than 1 day. MAKE SURE YOU:  Understand these instructions.  Watch your condition.  Get help right away if you are not doing well or if you get worse. Document Released: 07/20/2011 Document Revised: 01/29/2012 Document Reviewed: 07/20/2011 [facility] Patient Information 2015 [place], [place]. This information is not intended to replace advice given to you by your health care provider. Make sure you discuss any questions you have with your health care provider.

## 2015-06-22 DIAGNOSIS — M25512 Pain in left shoulder: Secondary | ICD-10-CM | POA: Diagnosis not present

## 2015-06-22 DIAGNOSIS — M75102 Unspecified rotator cuff tear or rupture of left shoulder, not specified as traumatic: Secondary | ICD-10-CM | POA: Diagnosis not present

## 2015-06-22 DIAGNOSIS — M75101 Unspecified rotator cuff tear or rupture of right shoulder, not specified as traumatic: Secondary | ICD-10-CM | POA: Diagnosis not present

## 2015-06-22 DIAGNOSIS — M25511 Pain in right shoulder: Secondary | ICD-10-CM | POA: Diagnosis not present

## 2015-07-12 ENCOUNTER — Ambulatory Visit (INDEPENDENT_AMBULATORY_CARE_PROVIDER_SITE_OTHER): Payer: Medicare Other | Admitting: *Deleted

## 2015-07-12 DIAGNOSIS — Z5181 Encounter for therapeutic drug level monitoring: Secondary | ICD-10-CM | POA: Diagnosis not present

## 2015-07-12 DIAGNOSIS — Z954 Presence of other heart-valve replacement: Secondary | ICD-10-CM

## 2015-07-12 DIAGNOSIS — Z7901 Long term (current) use of anticoagulants: Secondary | ICD-10-CM | POA: Diagnosis not present

## 2015-07-12 DIAGNOSIS — Z952 Presence of prosthetic heart valve: Secondary | ICD-10-CM

## 2015-07-12 LAB — POCT INR: INR: 2.8

## 2015-08-19 ENCOUNTER — Other Ambulatory Visit: Payer: Self-pay | Admitting: Adult Health

## 2015-08-23 ENCOUNTER — Ambulatory Visit (INDEPENDENT_AMBULATORY_CARE_PROVIDER_SITE_OTHER): Payer: Medicare Other | Admitting: *Deleted

## 2015-08-23 DIAGNOSIS — Z7901 Long term (current) use of anticoagulants: Secondary | ICD-10-CM | POA: Diagnosis not present

## 2015-08-23 DIAGNOSIS — Z5181 Encounter for therapeutic drug level monitoring: Secondary | ICD-10-CM

## 2015-08-23 DIAGNOSIS — Z954 Presence of other heart-valve replacement: Secondary | ICD-10-CM | POA: Diagnosis not present

## 2015-08-23 DIAGNOSIS — Z952 Presence of prosthetic heart valve: Secondary | ICD-10-CM

## 2015-08-23 LAB — POCT INR: INR: 3

## 2015-09-09 ENCOUNTER — Encounter: Payer: Self-pay | Admitting: Orthopedic Surgery

## 2015-09-09 ENCOUNTER — Ambulatory Visit: Payer: Medicare Other | Admitting: Orthopedic Surgery

## 2015-09-20 ENCOUNTER — Other Ambulatory Visit: Payer: Self-pay | Admitting: Adult Health

## 2015-09-20 ENCOUNTER — Other Ambulatory Visit: Payer: Self-pay | Admitting: Cardiovascular Disease

## 2015-10-04 ENCOUNTER — Ambulatory Visit (INDEPENDENT_AMBULATORY_CARE_PROVIDER_SITE_OTHER): Payer: Medicare Other | Admitting: *Deleted

## 2015-10-04 DIAGNOSIS — Z954 Presence of other heart-valve replacement: Secondary | ICD-10-CM

## 2015-10-04 DIAGNOSIS — Z7901 Long term (current) use of anticoagulants: Secondary | ICD-10-CM

## 2015-10-04 DIAGNOSIS — Z952 Presence of prosthetic heart valve: Secondary | ICD-10-CM

## 2015-10-04 DIAGNOSIS — Z5181 Encounter for therapeutic drug level monitoring: Secondary | ICD-10-CM

## 2015-10-04 LAB — POCT INR: INR: 1.7

## 2015-10-18 ENCOUNTER — Ambulatory Visit (INDEPENDENT_AMBULATORY_CARE_PROVIDER_SITE_OTHER): Payer: Medicare Other | Admitting: *Deleted

## 2015-10-18 DIAGNOSIS — Z5181 Encounter for therapeutic drug level monitoring: Secondary | ICD-10-CM

## 2015-10-18 DIAGNOSIS — Z954 Presence of other heart-valve replacement: Secondary | ICD-10-CM

## 2015-10-18 DIAGNOSIS — Z7901 Long term (current) use of anticoagulants: Secondary | ICD-10-CM

## 2015-10-18 DIAGNOSIS — Z952 Presence of prosthetic heart valve: Secondary | ICD-10-CM

## 2015-10-18 LAB — POCT INR: INR: 2.6

## 2015-10-25 ENCOUNTER — Other Ambulatory Visit: Payer: Self-pay | Admitting: Adult Health

## 2015-11-04 ENCOUNTER — Telehealth: Payer: Self-pay | Admitting: *Deleted

## 2015-11-04 NOTE — Telephone Encounter (Signed)
Jamie from [facility] called stating they have been unable to schedule this patient.

## 2015-11-10 ENCOUNTER — Encounter: Payer: Self-pay | Admitting: *Deleted

## 2015-11-10 ENCOUNTER — Ambulatory Visit (INDEPENDENT_AMBULATORY_CARE_PROVIDER_SITE_OTHER): Payer: Medicare Other | Admitting: Pharmacist

## 2015-11-10 DIAGNOSIS — Z7901 Long term (current) use of anticoagulants: Secondary | ICD-10-CM | POA: Diagnosis not present

## 2015-11-10 DIAGNOSIS — Z5181 Encounter for therapeutic drug level monitoring: Secondary | ICD-10-CM | POA: Diagnosis not present

## 2015-11-10 DIAGNOSIS — Z954 Presence of other heart-valve replacement: Secondary | ICD-10-CM | POA: Diagnosis not present

## 2015-11-10 DIAGNOSIS — Z952 Presence of prosthetic heart valve: Secondary | ICD-10-CM

## 2015-11-10 LAB — POCT INR: INR: 2.3

## 2015-11-25 ENCOUNTER — Other Ambulatory Visit: Payer: Self-pay | Admitting: Cardiovascular Disease

## 2015-11-25 ENCOUNTER — Other Ambulatory Visit: Payer: Self-pay | Admitting: Adult Health

## 2015-12-01 ENCOUNTER — Ambulatory Visit (INDEPENDENT_AMBULATORY_CARE_PROVIDER_SITE_OTHER): Payer: Medicare Other | Admitting: *Deleted

## 2015-12-01 DIAGNOSIS — Z954 Presence of other heart-valve replacement: Secondary | ICD-10-CM

## 2015-12-01 DIAGNOSIS — Z952 Presence of prosthetic heart valve: Secondary | ICD-10-CM

## 2015-12-01 DIAGNOSIS — Z7901 Long term (current) use of anticoagulants: Secondary | ICD-10-CM

## 2015-12-01 DIAGNOSIS — Z5181 Encounter for therapeutic drug level monitoring: Secondary | ICD-10-CM

## 2015-12-01 LAB — POCT INR: INR: 2

## 2015-12-01 MED ORDER — DIGOXIN 125 MCG PO TABS: ORAL_TABLET | ORAL | Status: DC

## 2015-12-09 ENCOUNTER — Other Ambulatory Visit: Payer: Self-pay | Admitting: Adult Health

## 2015-12-29 ENCOUNTER — Ambulatory Visit (INDEPENDENT_AMBULATORY_CARE_PROVIDER_SITE_OTHER): Payer: Medicare Other | Admitting: *Deleted

## 2015-12-29 DIAGNOSIS — Z7901 Long term (current) use of anticoagulants: Secondary | ICD-10-CM | POA: Diagnosis not present

## 2015-12-29 DIAGNOSIS — Z5181 Encounter for therapeutic drug level monitoring: Secondary | ICD-10-CM | POA: Diagnosis not present

## 2015-12-29 DIAGNOSIS — Z954 Presence of other heart-valve replacement: Secondary | ICD-10-CM

## 2015-12-29 DIAGNOSIS — Z952 Presence of prosthetic heart valve: Secondary | ICD-10-CM

## 2015-12-29 LAB — POCT INR: INR: 2.1

## 2016-01-19 ENCOUNTER — Encounter: Payer: Self-pay | Admitting: *Deleted

## 2016-01-26 ENCOUNTER — Ambulatory Visit (INDEPENDENT_AMBULATORY_CARE_PROVIDER_SITE_OTHER): Payer: Medicare Other | Admitting: Cardiovascular Disease

## 2016-01-26 ENCOUNTER — Ambulatory Visit: Payer: Self-pay | Admitting: Cardiovascular Disease

## 2016-01-26 ENCOUNTER — Encounter: Payer: Self-pay | Admitting: Cardiovascular Disease

## 2016-01-26 VITALS — BP 138/80 | HR 60 | Ht 73.0 in | Wt 268.0 lb

## 2016-01-26 DIAGNOSIS — Z954 Presence of other heart-valve replacement: Secondary | ICD-10-CM

## 2016-01-26 DIAGNOSIS — I10 Essential (primary) hypertension: Secondary | ICD-10-CM

## 2016-01-26 DIAGNOSIS — E785 Hyperlipidemia, unspecified: Secondary | ICD-10-CM | POA: Diagnosis not present

## 2016-01-26 DIAGNOSIS — Z9889 Other specified postprocedural states: Secondary | ICD-10-CM

## 2016-01-26 DIAGNOSIS — Z7901 Long term (current) use of anticoagulants: Secondary | ICD-10-CM

## 2016-01-26 DIAGNOSIS — Z952 Presence of prosthetic heart valve: Secondary | ICD-10-CM

## 2016-01-26 NOTE — Progress Notes (Signed)
Patient ID: Sevon Rotert, male   DOB: [birth year], 65 y.o.   MRN: [id number]      SUBJECTIVE: The patient presents for routine follow-up. He has a past medical history significant for aortic valve replacement (St. 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   He also has hypertension and hyperlipidemia.   His cardiac catheterization prior to his surgery reportedly revealed normal coronary arteries with a dominant left circumflex coronary artery.   He had an echocardiogram in January 2014 which showed normal left ventricular systolic function with an ejection fraction of 50-55%, a normally functioning prosthetic aortic valve with velocities of 2.5 m/s, and moderate left ventricular hypertrophy.  He has been feeling well and denies chest pain, shortness of breath, palpitations and leg swelling.    Review of Systems: As per "subjective", otherwise negative.  Allergies  Allergen Reactions  . Iohexol Hives, Itching and Swelling    Extremity swelling and hives  . Aspirin     On coumadin  . Contrast Media [Iodinated Diagnostic Agents]     Current Outpatient Prescriptions  Medication Sig Dispense Refill  . amLODipine (NORVASC) 5 MG tablet TAKE ONE TABLET BY MOUTH DAILY. 30 tablet 3  . carvedilol (COREG) 25 MG tablet TAKE ONE TABLET BY MOUTH ONCE DAILY. 30 tablet 3  . digoxin (DIGOX) 0.125 MG tablet TAKE 1/2 TABLET BY MOUTH ONCE DAILY. 30 tablet 6  . folic acid (FOLVITE) 1 MG tablet TAKE ONE TABLET BY MOUTH DAILY. 30 tablet 0  . HYDROcodone-acetaminophen (NORCO) 10-325 MG per tablet Take 1 tablet by mouth every 4 (four) hours as needed for pain.    . ramipril (ALTACE) 10 MG capsule TAKE 1 CAPSULE BY MOUTH DAILY. 90 capsule 1  . simvastatin (ZOCOR) 40 MG tablet TAKE ONE TABLET BY MOUTH DAILY AT BEDTIME. 30 tablet 0  . tamsulosin (FLOMAX) 0.4 MG CAPS capsule TAKE ONE CAPSULE BY  MOUTH AT BEDTIME. 90 capsule 3  . triamterene-hydrochlorothiazide (MAXZIDE-25) 37.5-25 MG tablet TAKE ONE TABLET BY MOUTH THREE TIMES WEEKLY. 15 tablet 1  . warfarin (COUMADIN) 5 MG tablet TAKE 1 AND 1/2 TABLETS BY MOUTH DAILY AS DIRECTED. 60 tablet 0   No current facility-administered medications for this visit.    Past Medical History  Diagnosis Date  . Coronary artery disease   . Hypertension   . Poor historian   . Sexual dysfunction     Past Surgical History  Procedure Laterality Date  . Exploration post operative open heart      open heart surgery  . Coronary angioplasty with stent placement    . Circumcision  2006    --which was uncomplicated    Social History   Social History  . Marital Status: Divorced    Spouse Name: N/A  . Number of Children: N/A  . Years of Education: N/A   Occupational History  . Not on file.   Social History Main Topics  . Smoking status: Never Smoker   . Smokeless tobacco: Not on file  . Alcohol Use: No  . Drug Use: Yes    Special: Marijuana     Comment: last use 1 month ago  . Sexual Activity: Yes    Birth Control/ Protection: None   Other Topics Concern  . Not on file   Social History Narrative     Filed Vitals:   01/26/16 1631  BP: 138/80  Pulse: 60  Height: 6\' 1"  (1.854 m)  Weight: 268 lb (121.564 kg)  SpO2: 98%    PHYSICAL EXAM General: NAD Neck: No JVD, no thyromegaly or thyroid nodule.  Lungs: Diminished throughout with faint end expiratory wheezes. No rales. CV: Nondisplaced PMI. Heart regular S1/prosthetic S2 click, no S3/S4, I/VI systolic murmur best appreciated at RUSB. No peripheral edema.  Abdomen: Obese.  Skin: Intact without lesions or rashes.  Neurologic: Alert and oriented x 3.  Psych: Normal affect. Extremities: No clubbing or cyanosis.  HEENT: Normal.   ECG: Most recent ECG reviewed.      ASSESSMENT AND PLAN: 1. S/p AVR and aortic root repair: Normal AV velocities in  11/2012, with no diastolic murmur appreciated. Continue warfarin.  2. Essential HTN: Well controlled on present therapy. No changes.  3. Hyperlipidemia: On statin.  Dispo: f/u in 1 year.   Prentice Docker, M.D., F.A.C.C.

## 2016-01-26 NOTE — Patient Instructions (Signed)

## 2016-01-29 ENCOUNTER — Other Ambulatory Visit: Payer: Self-pay | Admitting: Cardiovascular Disease

## 2016-01-29 ENCOUNTER — Other Ambulatory Visit: Payer: Self-pay | Admitting: Adult Health

## 2016-01-31 ENCOUNTER — Ambulatory Visit (INDEPENDENT_AMBULATORY_CARE_PROVIDER_SITE_OTHER): Payer: Medicare Other | Admitting: *Deleted

## 2016-01-31 DIAGNOSIS — Z952 Presence of prosthetic heart valve: Secondary | ICD-10-CM

## 2016-01-31 DIAGNOSIS — Z954 Presence of other heart-valve replacement: Secondary | ICD-10-CM | POA: Diagnosis not present

## 2016-01-31 DIAGNOSIS — Z7901 Long term (current) use of anticoagulants: Secondary | ICD-10-CM | POA: Diagnosis not present

## 2016-01-31 DIAGNOSIS — Z5181 Encounter for therapeutic drug level monitoring: Secondary | ICD-10-CM | POA: Diagnosis not present

## 2016-01-31 LAB — POCT INR: INR: 3.2

## 2016-02-11 ENCOUNTER — Other Ambulatory Visit: Payer: Self-pay | Admitting: Adult Health

## 2016-02-25 ENCOUNTER — Other Ambulatory Visit: Payer: Self-pay | Admitting: Adult Health

## 2016-02-28 ENCOUNTER — Ambulatory Visit (INDEPENDENT_AMBULATORY_CARE_PROVIDER_SITE_OTHER): Payer: Medicare Other | Admitting: *Deleted

## 2016-02-28 DIAGNOSIS — Z5181 Encounter for therapeutic drug level monitoring: Secondary | ICD-10-CM | POA: Diagnosis not present

## 2016-02-28 DIAGNOSIS — Z952 Presence of prosthetic heart valve: Secondary | ICD-10-CM

## 2016-02-28 DIAGNOSIS — Z954 Presence of other heart-valve replacement: Secondary | ICD-10-CM

## 2016-02-28 DIAGNOSIS — Z7901 Long term (current) use of anticoagulants: Secondary | ICD-10-CM | POA: Diagnosis not present

## 2016-02-28 LAB — POCT INR: INR: 2.9

## 2016-03-09 ENCOUNTER — Ambulatory Visit (INDEPENDENT_AMBULATORY_CARE_PROVIDER_SITE_OTHER): Payer: Medicare Other | Admitting: *Deleted

## 2016-03-09 DIAGNOSIS — Z7901 Long term (current) use of anticoagulants: Secondary | ICD-10-CM

## 2016-03-09 DIAGNOSIS — Z5181 Encounter for therapeutic drug level monitoring: Secondary | ICD-10-CM | POA: Diagnosis not present

## 2016-03-09 DIAGNOSIS — Z952 Presence of prosthetic heart valve: Secondary | ICD-10-CM

## 2016-03-09 DIAGNOSIS — Z954 Presence of other heart-valve replacement: Secondary | ICD-10-CM

## 2016-03-09 LAB — POCT INR: INR: 3.2

## 2016-03-10 ENCOUNTER — Other Ambulatory Visit: Payer: Self-pay | Admitting: Adult Health

## 2016-03-27 ENCOUNTER — Ambulatory Visit (INDEPENDENT_AMBULATORY_CARE_PROVIDER_SITE_OTHER): Payer: Medicare Other | Admitting: *Deleted

## 2016-03-27 DIAGNOSIS — Z954 Presence of other heart-valve replacement: Secondary | ICD-10-CM

## 2016-03-27 DIAGNOSIS — Z5181 Encounter for therapeutic drug level monitoring: Secondary | ICD-10-CM | POA: Diagnosis not present

## 2016-03-27 DIAGNOSIS — Z7901 Long term (current) use of anticoagulants: Secondary | ICD-10-CM

## 2016-03-27 DIAGNOSIS — Z952 Presence of prosthetic heart valve: Secondary | ICD-10-CM

## 2016-03-27 LAB — POCT INR: INR: 3.7

## 2016-03-29 ENCOUNTER — Other Ambulatory Visit: Payer: Self-pay | Admitting: Adult Health

## 2016-04-24 ENCOUNTER — Ambulatory Visit (INDEPENDENT_AMBULATORY_CARE_PROVIDER_SITE_OTHER): Payer: Medicare Other | Admitting: *Deleted

## 2016-04-24 DIAGNOSIS — Z7901 Long term (current) use of anticoagulants: Secondary | ICD-10-CM | POA: Diagnosis not present

## 2016-04-24 DIAGNOSIS — Z954 Presence of other heart-valve replacement: Secondary | ICD-10-CM | POA: Diagnosis not present

## 2016-04-24 DIAGNOSIS — Z952 Presence of prosthetic heart valve: Secondary | ICD-10-CM

## 2016-04-24 DIAGNOSIS — Z5181 Encounter for therapeutic drug level monitoring: Secondary | ICD-10-CM | POA: Diagnosis not present

## 2016-04-24 LAB — POCT INR: INR: 4.3

## 2016-05-17 ENCOUNTER — Ambulatory Visit (INDEPENDENT_AMBULATORY_CARE_PROVIDER_SITE_OTHER): Payer: Medicare Other | Admitting: *Deleted

## 2016-05-17 DIAGNOSIS — Z5181 Encounter for therapeutic drug level monitoring: Secondary | ICD-10-CM

## 2016-05-17 DIAGNOSIS — Z954 Presence of other heart-valve replacement: Secondary | ICD-10-CM | POA: Diagnosis not present

## 2016-05-17 DIAGNOSIS — Z952 Presence of prosthetic heart valve: Secondary | ICD-10-CM

## 2016-05-17 DIAGNOSIS — Z7901 Long term (current) use of anticoagulants: Secondary | ICD-10-CM | POA: Diagnosis not present

## 2016-05-17 LAB — POCT INR: INR: 3.2

## 2016-05-26 ENCOUNTER — Other Ambulatory Visit: Payer: Self-pay | Admitting: Adult Health

## 2016-05-26 ENCOUNTER — Telehealth: Payer: Self-pay | Admitting: Cardiovascular Disease

## 2016-05-26 NOTE — Telephone Encounter (Signed)
Returned call to pt & advised that we did not know about having teeth pulled today.  Pt states he did not tell the [place] office about it because he did not know to do so & that in the Dental office his INR was 3.3, explained to the pt that the INR was in his range however for multiple extractions the dentist would like it lower.  Educated pt on the importance of communicating dental procedures with Anticoagulation Clinic  & that we need to speak with dental office in reference to where they would like INR & if he needs to hold Coumadin we would need approval from Cardiologist. They politely provided me with dentist info: Dr. Hubert Azure [phone].  Called Dr. Deirdre Peer office & spoke with Sharlyne Cai & instructed her that if they need him to hold his Coumadin we would need a clearance form faxed over & provided her with our fax number. Pt is aware that we are contacting Dentist office to note where they would like his INR.

## 2016-05-26 NOTE — Telephone Encounter (Signed)
Patient went to have dental procedure this morning. Stated that "his blood was too thin to do surgery". Was told to call office to be advised on what he could do to increase it. / tg

## 2016-05-29 ENCOUNTER — Telehealth: Payer: Self-pay | Admitting: Pharmacist

## 2016-05-29 NOTE — Telephone Encounter (Signed)
Received fax from Dr. Deirdre Peer office that INR needs to be less than 3 for dental work. Called their office and they scheduled patient's dental work for 7/20. Called pt and advised him to take Coumadin 5mg  on 7/17 and 7/18, then scheduled him for INR check with Misty Stanley on 7/19. Pt verbalized understanding. Clearance faxed to Dr. Gabriel Carina office [phone].

## 2016-06-02 ENCOUNTER — Telehealth: Payer: Self-pay | Admitting: *Deleted

## 2016-06-02 ENCOUNTER — Ambulatory Visit (INDEPENDENT_AMBULATORY_CARE_PROVIDER_SITE_OTHER): Payer: Medicare Other

## 2016-06-02 DIAGNOSIS — Z5181 Encounter for therapeutic drug level monitoring: Secondary | ICD-10-CM | POA: Diagnosis not present

## 2016-06-02 DIAGNOSIS — Z954 Presence of other heart-valve replacement: Secondary | ICD-10-CM | POA: Diagnosis not present

## 2016-06-02 DIAGNOSIS — Z7901 Long term (current) use of anticoagulants: Secondary | ICD-10-CM

## 2016-06-02 DIAGNOSIS — Z952 Presence of prosthetic heart valve: Secondary | ICD-10-CM

## 2016-06-02 LAB — POCT INR: INR: 3.3

## 2016-06-02 NOTE — Telephone Encounter (Signed)
[place] from Dr. Deirdre Peer office & stated they have rescheduled the pt's dental extraction to 06/05/16 from 06/08/16 because the pt is in severe pain & they had an opening. Spoke with pt & rescheduled pt's appt in [place] office, spoke with RN Deanna Artis at [place] office to notify her off the pt coming in to have INR checked. Deanna Artis at [place] instructed to call us while pt is in the office so we can give instructions & make appropriate adjustments.

## 2016-06-13 ENCOUNTER — Telehealth: Payer: Self-pay | Admitting: Cardiovascular Disease

## 2016-06-13 NOTE — Telephone Encounter (Signed)
Pt walked in and needed help with getting his medications filled and also the dosing. I called [facility] and got his cardiac medications filled for him and also sent them a fax to please help him with dosage questions.

## 2016-07-03 ENCOUNTER — Encounter: Payer: Self-pay | Admitting: *Deleted

## 2016-07-05 ENCOUNTER — Ambulatory Visit (INDEPENDENT_AMBULATORY_CARE_PROVIDER_SITE_OTHER): Payer: Medicare Other | Admitting: *Deleted

## 2016-07-05 DIAGNOSIS — Z5181 Encounter for therapeutic drug level monitoring: Secondary | ICD-10-CM | POA: Diagnosis not present

## 2016-07-05 DIAGNOSIS — Z7901 Long term (current) use of anticoagulants: Secondary | ICD-10-CM

## 2016-07-05 DIAGNOSIS — Z952 Presence of prosthetic heart valve: Secondary | ICD-10-CM

## 2016-07-05 DIAGNOSIS — Z954 Presence of other heart-valve replacement: Secondary | ICD-10-CM

## 2016-07-05 LAB — POCT INR: INR: 2.4

## 2016-07-14 ENCOUNTER — Emergency Department (HOSPITAL_COMMUNITY): Payer: Medicare Other

## 2016-07-14 ENCOUNTER — Observation Stay (HOSPITAL_COMMUNITY)
Admission: EM | Admit: 2016-07-14 | Discharge: 2016-07-16 | Discharge status: Against Medical Advice | Payer: Medicare Other | Attending: Internal Medicine | Admitting: Internal Medicine

## 2016-07-14 ENCOUNTER — Encounter (HOSPITAL_COMMUNITY): Payer: Self-pay | Admitting: Emergency Medicine

## 2016-07-14 DIAGNOSIS — Z79899 Other long term (current) drug therapy: Secondary | ICD-10-CM | POA: Insufficient documentation

## 2016-07-14 DIAGNOSIS — R079 Chest pain, unspecified: Secondary | ICD-10-CM | POA: Diagnosis not present

## 2016-07-14 DIAGNOSIS — Z7901 Long term (current) use of anticoagulants: Secondary | ICD-10-CM | POA: Insufficient documentation

## 2016-07-14 DIAGNOSIS — R0789 Other chest pain: Secondary | ICD-10-CM | POA: Diagnosis not present

## 2016-07-14 DIAGNOSIS — F32A Depression, unspecified: Secondary | ICD-10-CM

## 2016-07-14 DIAGNOSIS — I2510 Atherosclerotic heart disease of native coronary artery without angina pectoris: Secondary | ICD-10-CM | POA: Diagnosis not present

## 2016-07-14 DIAGNOSIS — F321 Major depressive disorder, single episode, moderate: Secondary | ICD-10-CM | POA: Diagnosis not present

## 2016-07-14 DIAGNOSIS — F329 Major depressive disorder, single episode, unspecified: Secondary | ICD-10-CM | POA: Insufficient documentation

## 2016-07-14 DIAGNOSIS — I119 Hypertensive heart disease without heart failure: Secondary | ICD-10-CM | POA: Diagnosis not present

## 2016-07-14 LAB — LIPASE, BLOOD: Lipase: 27 U/L (ref 11–51)

## 2016-07-14 LAB — COMPREHENSIVE METABOLIC PANEL
ALBUMIN: 3.6 g/dL (ref 3.5–5.0)
ALT: 16 U/L — ABNORMAL LOW (ref 17–63)
ANION GAP: 3 — AB (ref 5–15)
AST: 24 U/L (ref 15–41)
Alkaline Phosphatase: 57 U/L (ref 38–126)
BILIRUBIN TOTAL: 0.6 mg/dL (ref 0.3–1.2)
BUN: 11 mg/dL (ref 6–20)
CO2: 24 mmol/L (ref 22–32)
Calcium: 8.1 mg/dL — ABNORMAL LOW (ref 8.9–10.3)
Chloride: 109 mmol/L (ref 101–111)
Creatinine, Ser: 0.96 mg/dL (ref 0.61–1.24)
GFR calc Af Amer: >60 mL/min (ref >60)
Glucose, Bld: 93 mg/dL (ref 65–99)
POTASSIUM: 3.2 mmol/L — AB (ref 3.5–5.1)
Sodium: 136 mmol/L (ref 135–145)
TOTAL PROTEIN: 6.9 g/dL (ref 6.5–8.1)

## 2016-07-14 LAB — CBC
HCT: 34.3 % — ABNORMAL LOW (ref 39.0–52.0)
Hemoglobin: 11.2 g/dL — ABNORMAL LOW (ref 13.0–17.0)
MCH: 24.9 pg — AB (ref 26.0–34.0)
MCHC: 32.7 g/dL (ref 30.0–36.0)
MCV: 76.2 fL — ABNORMAL LOW (ref 78.0–100.0)
PLATELETS: 192 10*3/uL (ref 150–400)
RBC: 4.5 MIL/uL (ref 4.22–5.81)
RDW: 15.3 % (ref 11.5–15.5)
WBC: 5.2 10*3/uL (ref 4.0–10.5)

## 2016-07-14 LAB — TROPONIN I: TROPONIN I: 0.05 ng/mL — AB (ref <0.03)

## 2016-07-14 MED ORDER — ACETAMINOPHEN 500 MG PO TABS
1000.0000 mg | ORAL_TABLET | Freq: Once | ORAL | Status: AC
  Administered 2016-07-14: 1000 mg via ORAL
  Filled 2016-07-14: qty 2

## 2016-07-14 MED ORDER — GI COCKTAIL ~~LOC~~
30.0000 mL | Freq: Once | ORAL | Status: AC
  Administered 2016-07-14: 30 mL via ORAL
  Filled 2016-07-14: qty 30

## 2016-07-14 NOTE — ED Notes (Signed)
CRITICAL VALUE ALERT  Critical value received:  Troponin 0.05  Date of notification:  07/14/16  Time of notification:  2234 hrs  Critical value read back:Yes.    Nurse who received alert:  Y. Naiyana Barbian, RN  Responding MD: Dr. Jodi Mourning  Time MD responded:  2234 hrs
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
## 2016-07-14 NOTE — ED Triage Notes (Signed)
Given three nitro by [facility].

## 2016-07-15 DIAGNOSIS — R079 Chest pain, unspecified: Secondary | ICD-10-CM | POA: Diagnosis not present

## 2016-07-15 DIAGNOSIS — R0789 Other chest pain: Secondary | ICD-10-CM | POA: Diagnosis not present

## 2016-07-15 DIAGNOSIS — F329 Major depressive disorder, single episode, unspecified: Secondary | ICD-10-CM

## 2016-07-15 DIAGNOSIS — F321 Major depressive disorder, single episode, moderate: Secondary | ICD-10-CM | POA: Diagnosis not present

## 2016-07-15 LAB — BASIC METABOLIC PANEL
ANION GAP: 5 (ref 5–15)
BUN: 9 mg/dL (ref 6–20)
CALCIUM: 8.2 mg/dL — AB (ref 8.9–10.3)
CO2: 25 mmol/L (ref 22–32)
CREATININE: 0.86 mg/dL (ref 0.61–1.24)
Chloride: 108 mmol/L (ref 101–111)
GFR calc Af Amer: >60 mL/min (ref >60)
GLUCOSE: 94 mg/dL (ref 65–99)
POTASSIUM: 3.3 mmol/L — AB (ref 3.5–5.1)
Sodium: 138 mmol/L (ref 135–145)

## 2016-07-15 LAB — TROPONIN I
TROPONIN I: 0.04 ng/mL — AB (ref <0.03)
TROPONIN I: 0.05 ng/mL — AB (ref <0.03)
Troponin I: 0.05 ng/mL (ref <0.03)

## 2016-07-15 LAB — PROTIME-INR
INR: 3.1
Prothrombin Time: 32.6 seconds — ABNORMAL HIGH (ref 11.4–15.2)

## 2016-07-15 MED ORDER — CARVEDILOL 12.5 MG PO TABS
25.0000 mg | ORAL_TABLET | Freq: Every day | ORAL | Status: DC
  Administered 2016-07-15 – 2016-07-16 (×2): 25 mg via ORAL
  Filled 2016-07-15 (×3): qty 2

## 2016-07-15 MED ORDER — HYDRALAZINE HCL 25 MG PO TABS: 25.0000 mg | ORAL_TABLET | Freq: Four times a day (QID) | ORAL | Status: DC | PRN

## 2016-07-15 MED ORDER — DIGOXIN 125 MCG PO TABS
0.0625 mg | ORAL_TABLET | Freq: Every day | ORAL | Status: DC
  Administered 2016-07-15 – 2016-07-16 (×2): 0.0625 mg via ORAL
  Filled 2016-07-15 (×2): qty 1

## 2016-07-15 MED ORDER — ALPRAZOLAM 0.5 MG PO TABS
0.5000 mg | ORAL_TABLET | Freq: Three times a day (TID) | ORAL | Status: DC | PRN
  Administered 2016-07-15 – 2016-07-16 (×3): 0.5 mg via ORAL
  Filled 2016-07-15 (×3): qty 1

## 2016-07-15 MED ORDER — POTASSIUM CHLORIDE CRYS ER 20 MEQ PO TBCR
20.0000 meq | EXTENDED_RELEASE_TABLET | Freq: Once | ORAL | Status: AC
  Administered 2016-07-15: 20 meq via ORAL
  Filled 2016-07-15: qty 1

## 2016-07-15 MED ORDER — AMLODIPINE BESYLATE 5 MG PO TABS
10.0000 mg | ORAL_TABLET | Freq: Every day | ORAL | Status: DC
  Administered 2016-07-15 – 2016-07-16 (×2): 10 mg via ORAL
  Filled 2016-07-15 (×2): qty 2

## 2016-07-15 MED ORDER — POTASSIUM CHLORIDE CRYS ER 20 MEQ PO TBCR
40.0000 meq | EXTENDED_RELEASE_TABLET | Freq: Once | ORAL | Status: AC
  Administered 2016-07-15: 40 meq via ORAL
  Filled 2016-07-15: qty 2

## 2016-07-15 MED ORDER — CITALOPRAM HYDROBROMIDE 20 MG PO TABS
20.0000 mg | ORAL_TABLET | Freq: Every day | ORAL | Status: DC
  Administered 2016-07-15 – 2016-07-16 (×2): 20 mg via ORAL
  Filled 2016-07-15 (×2): qty 1

## 2016-07-15 MED ORDER — RAMIPRIL 5 MG PO CAPS
10.0000 mg | ORAL_CAPSULE | Freq: Every day | ORAL | Status: DC
  Administered 2016-07-15 – 2016-07-16 (×2): 10 mg via ORAL
  Filled 2016-07-15 (×2): qty 2

## 2016-07-15 MED ORDER — MORPHINE SULFATE (PF) 2 MG/ML IV SOLN: 2.0000 mg | INTRAVENOUS | Status: DC | PRN

## 2016-07-15 MED ORDER — FOLIC ACID 1 MG PO TABS
1.0000 mg | ORAL_TABLET | Freq: Every day | ORAL | Status: DC
  Administered 2016-07-15 – 2016-07-16 (×2): 1 mg via ORAL
  Filled 2016-07-15 (×2): qty 1

## 2016-07-15 MED ORDER — WARFARIN - PHARMACIST DOSING INPATIENT
Freq: Every day | Status: DC
  Administered 2016-07-15: 18:00:00

## 2016-07-15 MED ORDER — SIMVASTATIN 20 MG PO TABS
40.0000 mg | ORAL_TABLET | Freq: Every day | ORAL | Status: DC
  Administered 2016-07-15 (×2): 40 mg via ORAL
  Filled 2016-07-15 (×2): qty 2

## 2016-07-15 MED ORDER — PANTOPRAZOLE SODIUM 40 MG PO TBEC
40.0000 mg | DELAYED_RELEASE_TABLET | Freq: Every day | ORAL | Status: DC
  Administered 2016-07-15 – 2016-07-16 (×2): 40 mg via ORAL
  Filled 2016-07-15: qty 1

## 2016-07-15 MED ORDER — ALPRAZOLAM 0.5 MG PO TABS
0.5000 mg | ORAL_TABLET | Freq: Once | ORAL | Status: AC
  Administered 2016-07-15: 0.5 mg via ORAL
  Filled 2016-07-15: qty 1

## 2016-07-15 MED ORDER — ACETAMINOPHEN 325 MG PO TABS
650.0000 mg | ORAL_TABLET | ORAL | Status: DC | PRN
  Administered 2016-07-15 (×2): 650 mg via ORAL
  Filled 2016-07-15 (×2): qty 2

## 2016-07-15 MED ORDER — WARFARIN SODIUM 5 MG PO TABS
7.5000 mg | ORAL_TABLET | Freq: Once | ORAL | Status: AC
  Administered 2016-07-15: 7.5 mg via ORAL
  Filled 2016-07-15: qty 2

## 2016-07-15 MED ORDER — SIMETHICONE 80 MG PO CHEW
80.0000 mg | CHEWABLE_TABLET | Freq: Three times a day (TID) | ORAL | Status: AC
  Administered 2016-07-15 (×2): 80 mg via ORAL
  Filled 2016-07-15 (×2): qty 1

## 2016-07-15 MED ORDER — TRAMADOL HCL 50 MG PO TABS
50.0000 mg | ORAL_TABLET | Freq: Four times a day (QID) | ORAL | Status: DC | PRN
  Administered 2016-07-15 – 2016-07-16 (×2): 50 mg via ORAL
  Filled 2016-07-15 (×2): qty 1

## 2016-07-15 MED ORDER — ONDANSETRON HCL 4 MG/2ML IJ SOLN: 4.0000 mg | Freq: Four times a day (QID) | INTRAMUSCULAR | Status: DC | PRN

## 2016-07-15 MED ORDER — AMLODIPINE BESYLATE 5 MG PO TABS
5.0000 mg | ORAL_TABLET | Freq: Every day | ORAL | Status: DC
  Filled 2016-07-15: qty 1

## 2016-07-15 MED ORDER — TRIAMTERENE-HCTZ 37.5-25 MG PO TABS
1.0000 | ORAL_TABLET | Freq: Every day | ORAL | Status: DC
  Administered 2016-07-15 – 2016-07-16 (×3): 1 via ORAL
  Filled 2016-07-15 (×3): qty 1

## 2016-07-15 MED ORDER — TAMSULOSIN HCL 0.4 MG PO CAPS
0.4000 mg | ORAL_CAPSULE | Freq: Every day | ORAL | Status: DC
  Administered 2016-07-15 (×2): 0.4 mg via ORAL
  Filled 2016-07-15 (×2): qty 1

## 2016-07-15 NOTE — Progress Notes (Signed)
ANTICOAGULATION CONSULT NOTE - Initial Consult  Pharmacy Consult for Coumadin Indication: Aortic mechanical valve  Allergies  Allergen Reactions  . Iohexol Hives, Itching and Swelling    Extremity swelling and hives  . Aspirin     On coumadin  . Contrast Media [Iodinated Diagnostic Agents]     Patient Measurements: Height: 6\' 1"  (185.4 cm) Weight: 240 lb 15.4 oz (109.3 kg) IBW/kg (Calculated) : 79.9  Vital Signs: Temp: 98.3 F (36.8 C) (08/26 0544) Temp Source: Oral (08/26 0544) BP: 157/100 (08/26 0729) Pulse Rate: 61 (08/26 0729)  Labs:  Recent Labs  07/14/16 2135 07/15/16 0056 07/15/16 0700  HGB 11.2*  --   --   HCT 34.3*  --   --   PLT 192  --   --   LABPROT 32.6*  --   --   INR 3.10  --   --   CREATININE 0.96  --  0.86  TROPONINI 0.05* 0.05* 0.05*    Estimated Creatinine Clearance: 115.5 mL/min (by C-G formula based on SCr of 0.86 mg/dL).   Medical History: Past Medical History:  Diagnosis Date  . Coronary artery disease   . Hypertension   . Poor historian   . Sexual dysfunction     Medications:  Prescriptions Prior to Admission  Medication Sig Dispense Refill Last Dose  . amLODipine (NORVASC) 5 MG tablet TAKE ONE TABLET BY MOUTH DAILY. 30 tablet 11 Past Week at Unknown time  . carvedilol (COREG) 25 MG tablet TAKE ONE TABLET BY MOUTH ONCE DAILY. 30 tablet 6 Past Week at Unknown time  . digoxin (DIGOX) 0.125 MG tablet TAKE 1/2 TABLET BY MOUTH ONCE DAILY. (Patient taking differently: Take 0.0625 mg by mouth daily. TAKE 1/2 TABLET BY MOUTH ONCE DAILY.) 30 tablet 6 Past Week at Unknown time  . HYDROcodone-acetaminophen (NORCO) 10-325 MG per tablet Take 1 tablet by mouth every 4 (four) hours as needed for pain.   Past Week at Unknown time  . ramipril (ALTACE) 10 MG capsule TAKE 1 CAPSULE BY MOUTH DAILY. 90 capsule 3 Past Week at Unknown time  . triamterene-hydrochlorothiazide (MAXZIDE-25) 37.5-25 MG tablet TAKE ONE TABLET BY MOUTH THREE TIMES WEEKLY. 15  tablet 6 Past Week at Unknown time  . warfarin (COUMADIN) 5 MG tablet Take 1 1/2 tablets daily except 2 tablets on Mondays, Wednesdays and Fridays (Patient taking differently: Take 7.5-10 mg by mouth every evening. Take 1 1/2 tablets daily except 2 tablets on Mondays, Wednesdays and Fridays) 60 tablet 3 Past Week at Unknown time  . folic acid (FOLVITE) 1 MG tablet TAKE ONE TABLET BY MOUTH DAILY. (Patient not taking: Reported on 07/14/2016) 30 tablet 6 Not Taking at Unknown time  . simvastatin (ZOCOR) 40 MG tablet TAKE ONE TABLET BY MOUTH DAILY AT BEDTIME. (Patient not taking: Reported on 07/14/2016) 30 tablet 6 Not Taking at Unknown time  . tamsulosin (FLOMAX) 0.4 MG CAPS capsule TAKE ONE CAPSULE BY MOUTH AT BEDTIME. (Patient not taking: Reported on 07/14/2016) 90 capsule 3 Not Taking at Unknown time    Assessment:  65 y.o. male, with history of  status post St. Jude mechanical aortic valve, aortic root repair, hypertension, hyperlipidemia who came to the hospital with chest pain, non-radiating. Continue Coumadin therapy for anticoagulation. According to anticoag clinic dose should be 7.5mg  daily except 10mg  on Monday.  Therapeutic INR of 3.1 with a goal of 2.5-3.5. CBC within normal limits.  Goal of Therapy:  2.5-3.5 Monitor platelets by anticoagulation protocol: Yes   Plan:  Coumadin 7.5mg  today Daily PT/INR Monitor for S/S of bleeding  Thank you for allowing [place] to participate in the care of this patient, Elder Cyphers, BS Loura Back, BCPS Clinical Pharmacist Pager #[phone] 07/15/2016,8:56 AM
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
## 2016-07-15 NOTE — Progress Notes (Signed)
Late Entry:  Approx: 1030 this am I notified Dr. Selena Batten due to the patient being very upset, crying, and stating that he wanted to kill himself.  MD stated that he would go back and see the patient.   Medications from home was given to his daughter to take home.  Discussed plan of care with the patients family with the patients permission.
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
## 2016-07-16 ENCOUNTER — Observation Stay (HOSPITAL_COMMUNITY): Payer: Medicare Other

## 2016-07-16 DIAGNOSIS — R0789 Other chest pain: Secondary | ICD-10-CM | POA: Diagnosis not present

## 2016-07-16 DIAGNOSIS — F329 Major depressive disorder, single episode, unspecified: Secondary | ICD-10-CM

## 2016-07-16 DIAGNOSIS — F32A Depression, unspecified: Secondary | ICD-10-CM

## 2016-07-16 DIAGNOSIS — R079 Chest pain, unspecified: Secondary | ICD-10-CM

## 2016-07-16 DIAGNOSIS — F321 Major depressive disorder, single episode, moderate: Secondary | ICD-10-CM

## 2016-07-16 LAB — PROTIME-INR
INR: 2.26
Prothrombin Time: 25.3 seconds — ABNORMAL HIGH (ref 11.4–15.2)

## 2016-07-16 LAB — ECHOCARDIOGRAM COMPLETE
HEIGHTINCHES: 73 in
WEIGHTICAEL: 3855.4 [oz_av]

## 2016-07-16 MED ORDER — WARFARIN SODIUM 5 MG PO TABS: 10.0000 mg | ORAL_TABLET | Freq: Once | ORAL | Status: DC

## 2016-07-16 NOTE — Progress Notes (Signed)
Called to the patients room because the patient is being  Persistent in the fact that he wants to go home and is going to leave.  Dr. Selena Batten was also at the bedside.  I voiced to him that at the moment he could not leave because of the recommendations made by the psychiatry consult yesterday.  I voiced to him that if he tried to leave at this time then the [facility] [place] [employer] would be notified.  Dr. Selena Batten stated that we could re-order the psych. Consult to see if they say he can be safely released and the MD would not be called.  Dr. Selena Batten states that the patient would still need to be here to have an cardiology consult in the am.  He went on to say if the patient is cleared by psych. And still wants to leave then he would have to leave against medical advice.  Patient verbalizes understanding but continues to be agitated in the fact that he wants to leave because he states he has to go to the court house, feed his dogs, and feels better.  Consult to psych has been called and I told him I would keep him informed about his plan of care.  Security was also at the bedside.

## 2016-07-16 NOTE — Progress Notes (Signed)
*  PRELIMINARY RESULTS* Echocardiogram 2D Echocardiogram has been performed.  [name] 07/16/2016, 10:46 AM
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
## 2016-07-16 NOTE — Progress Notes (Signed)
Notified Dr. Selena Batten that according to documentation that the patient has been medically cleared to go ome.  The patient still says that he wants to leave and not stay to see cardiology in am.  MD verbalizes understanding and states that he will be up in about an hour to see the patient.  I will discuss this with the patient.  Notified security to let them know the patient had been cleared by psychiatry. Spoke with [employer].

## 2016-07-16 NOTE — Final Progress Note (Signed)
Patient still continues to want to go home, even after talking with Dr. Selena Batten for the second time.  MD is aware that the patient is leaving AMA.   Form taken to the patient so that he could sign.  I explained the benefits for him to stay and be seen by the cardiologist and the risk if he left without being seen.  Patient verbalized understanding, signed the document, and left the floor AMA.  IV was removed from and the site was WNL.  All belongs left here was returned to him.  The patients wallet, medication, and keys was sent home with his daughter on yesterday.

## 2016-07-16 NOTE — Progress Notes (Signed)
ANTICOAGULATION CONSULT NOTE - Initial Consult  Pharmacy Consult for Coumadin Indication: Aortic mechanical valve  Allergies  Allergen Reactions  . Iohexol Hives, Itching and Swelling    Extremity swelling and hives  . Aspirin     On coumadin  . Contrast Media [Iodinated Diagnostic Agents]     Patient Measurements: Height: 6\' 1"  (185.4 cm) Weight: 240 lb 15.4 oz (109.3 kg) IBW/kg (Calculated) : 79.9  Vital Signs: Temp: 98.4 F (36.9 C) (08/27 0501) Temp Source: Oral (08/27 0501) BP: 138/94 (08/27 0501) Pulse Rate: 61 (08/27 0501)  Labs:  Recent Labs  07/14/16 2135 07/15/16 0056 07/15/16 0700 07/15/16 1357 07/16/16 0442  HGB 11.2*  --   --   --   --   HCT 34.3*  --   --   --   --   PLT 192  --   --   --   --   LABPROT 32.6*  --   --   --  25.3*  INR 3.10  --   --   --  2.26  CREATININE 0.96  --  0.86  --   --   TROPONINI 0.05* 0.05* 0.05* 0.04*  --     Estimated Creatinine Clearance: 115.5 mL/min (by C-G formula based on SCr of 0.86 mg/dL).   Medical History: Past Medical History:  Diagnosis Date  . Coronary artery disease   . Hypertension   . Poor historian   . Sexual dysfunction     Medications:  Prescriptions Prior to Admission  Medication Sig Dispense Refill Last Dose  . amLODipine (NORVASC) 5 MG tablet TAKE ONE TABLET BY MOUTH DAILY. 30 tablet 11 Past Week at Unknown time  . carvedilol (COREG) 25 MG tablet TAKE ONE TABLET BY MOUTH ONCE DAILY. 30 tablet 6 Past Week at Unknown time  . digoxin (DIGOX) 0.125 MG tablet TAKE 1/2 TABLET BY MOUTH ONCE DAILY. (Patient taking differently: Take 0.0625 mg by mouth daily. TAKE 1/2 TABLET BY MOUTH ONCE DAILY.) 30 tablet 6 Past Week at Unknown time  . HYDROcodone-acetaminophen (NORCO) 10-325 MG per tablet Take 1 tablet by mouth every 4 (four) hours as needed for pain.   Past Week at Unknown time  . ramipril (ALTACE) 10 MG capsule TAKE 1 CAPSULE BY MOUTH DAILY. 90 capsule 3 Past Week at Unknown time  .  triamterene-hydrochlorothiazide (MAXZIDE-25) 37.5-25 MG tablet TAKE ONE TABLET BY MOUTH THREE TIMES WEEKLY. 15 tablet 6 Past Week at Unknown time  . warfarin (COUMADIN) 5 MG tablet Take 1 1/2 tablets daily except 2 tablets on Mondays, Wednesdays and Fridays (Patient taking differently: Take 7.5-10 mg by mouth every evening. Take 1 1/2 tablets daily except 2 tablets on Mondays, Wednesdays and Fridays) 60 tablet 3 Past Week at Unknown time  . folic acid (FOLVITE) 1 MG tablet TAKE ONE TABLET BY MOUTH DAILY. (Patient not taking: Reported on 07/14/2016) 30 tablet 6 Not Taking at Unknown time  . simvastatin (ZOCOR) 40 MG tablet TAKE ONE TABLET BY MOUTH DAILY AT BEDTIME. (Patient not taking: Reported on 07/14/2016) 30 tablet 6 Not Taking at Unknown time  . tamsulosin (FLOMAX) 0.4 MG CAPS capsule TAKE ONE CAPSULE BY MOUTH AT BEDTIME. (Patient not taking: Reported on 07/14/2016) 90 capsule 3 Not Taking at Unknown time    Assessment:  65 y.o. male, with history of  status post St. Jude mechanical aortic valve, aortic root repair, hypertension, hyperlipidemia who came to the hospital with chest pain, non-radiating. Continue Coumadin therapy for anticoagulation. According to anticoag  clinic dose should be 7.5mg  daily except 10mg  on Monday.  INR of 3.1-->2.26 today with a goal of 2.5-3.5. CBC within normal limits. Will give booster dose today of 10mg .  Goal of Therapy:  2.5-3.5 Monitor platelets by anticoagulation protocol: Yes   Plan:  Coumadin 10 mg today Daily PT/INR Monitor for S/S of bleeding  Thank you for allowing us to participate in the care of this patient, Elder CyphersLorie Cheyeanne Roadcap, BS Loura BackPharm D, BCPS Clinical Pharmacist Pager #[phone] 07/16/2016,9:03 AM
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
## 2016-08-10 ENCOUNTER — Other Ambulatory Visit: Payer: Self-pay | Admitting: Adult Health

## 2016-08-14 ENCOUNTER — Ambulatory Visit (INDEPENDENT_AMBULATORY_CARE_PROVIDER_SITE_OTHER): Payer: Medicare Other | Admitting: *Deleted

## 2016-08-14 DIAGNOSIS — Z954 Presence of other heart-valve replacement: Secondary | ICD-10-CM

## 2016-08-14 DIAGNOSIS — Z7901 Long term (current) use of anticoagulants: Secondary | ICD-10-CM | POA: Diagnosis not present

## 2016-08-14 DIAGNOSIS — Z5181 Encounter for therapeutic drug level monitoring: Secondary | ICD-10-CM | POA: Diagnosis not present

## 2016-08-14 DIAGNOSIS — Z952 Presence of prosthetic heart valve: Secondary | ICD-10-CM

## 2016-08-14 LAB — POCT INR: INR: 1.7

## 2016-08-23 ENCOUNTER — Ambulatory Visit (INDEPENDENT_AMBULATORY_CARE_PROVIDER_SITE_OTHER): Payer: Medicare Other | Admitting: *Deleted

## 2016-08-23 DIAGNOSIS — Z7901 Long term (current) use of anticoagulants: Secondary | ICD-10-CM

## 2016-08-23 DIAGNOSIS — Z952 Presence of prosthetic heart valve: Secondary | ICD-10-CM

## 2016-08-23 DIAGNOSIS — Z5181 Encounter for therapeutic drug level monitoring: Secondary | ICD-10-CM | POA: Diagnosis not present

## 2016-08-23 LAB — POCT INR: INR: 3.6

## 2016-09-14 ENCOUNTER — Other Ambulatory Visit: Payer: Self-pay | Admitting: Adult Health

## 2016-09-18 ENCOUNTER — Other Ambulatory Visit: Payer: Self-pay | Admitting: Adult Health

## 2016-09-20 ENCOUNTER — Ambulatory Visit (INDEPENDENT_AMBULATORY_CARE_PROVIDER_SITE_OTHER): Payer: Medicare Other | Admitting: *Deleted

## 2016-09-20 DIAGNOSIS — Z5181 Encounter for therapeutic drug level monitoring: Secondary | ICD-10-CM | POA: Diagnosis not present

## 2016-09-20 DIAGNOSIS — Z952 Presence of prosthetic heart valve: Secondary | ICD-10-CM | POA: Diagnosis not present

## 2016-09-20 DIAGNOSIS — Z7901 Long term (current) use of anticoagulants: Secondary | ICD-10-CM

## 2016-09-20 LAB — POCT INR: INR: 1.8

## 2016-09-26 ENCOUNTER — Other Ambulatory Visit: Payer: Self-pay | Admitting: Cardiovascular Disease

## 2016-10-09 ENCOUNTER — Ambulatory Visit (INDEPENDENT_AMBULATORY_CARE_PROVIDER_SITE_OTHER): Payer: Medicare Other | Admitting: *Deleted

## 2016-10-09 DIAGNOSIS — Z5181 Encounter for therapeutic drug level monitoring: Secondary | ICD-10-CM

## 2016-10-09 DIAGNOSIS — Z952 Presence of prosthetic heart valve: Secondary | ICD-10-CM

## 2016-10-09 DIAGNOSIS — Z7901 Long term (current) use of anticoagulants: Secondary | ICD-10-CM

## 2016-10-09 LAB — POCT INR: INR: 1.5

## 2016-10-18 ENCOUNTER — Encounter: Payer: Self-pay | Admitting: *Deleted

## 2016-10-23 ENCOUNTER — Ambulatory Visit (INDEPENDENT_AMBULATORY_CARE_PROVIDER_SITE_OTHER): Payer: Medicare Other | Admitting: *Deleted

## 2016-10-23 DIAGNOSIS — Z7901 Long term (current) use of anticoagulants: Secondary | ICD-10-CM | POA: Diagnosis not present

## 2016-10-23 DIAGNOSIS — Z5181 Encounter for therapeutic drug level monitoring: Secondary | ICD-10-CM | POA: Diagnosis not present

## 2016-10-23 DIAGNOSIS — Z952 Presence of prosthetic heart valve: Secondary | ICD-10-CM | POA: Diagnosis not present

## 2016-10-23 LAB — POCT INR: INR: 1.6

## 2016-11-02 ENCOUNTER — Other Ambulatory Visit: Payer: Self-pay | Admitting: Cardiovascular Disease

## 2016-11-06 ENCOUNTER — Ambulatory Visit (INDEPENDENT_AMBULATORY_CARE_PROVIDER_SITE_OTHER): Payer: Medicare Other | Admitting: *Deleted

## 2016-11-06 DIAGNOSIS — Z5181 Encounter for therapeutic drug level monitoring: Secondary | ICD-10-CM | POA: Diagnosis not present

## 2016-11-06 DIAGNOSIS — Z7901 Long term (current) use of anticoagulants: Secondary | ICD-10-CM | POA: Diagnosis not present

## 2016-11-06 DIAGNOSIS — Z952 Presence of prosthetic heart valve: Secondary | ICD-10-CM | POA: Diagnosis not present

## 2016-11-06 LAB — POCT INR: INR: 2.8

## 2016-11-27 ENCOUNTER — Ambulatory Visit (INDEPENDENT_AMBULATORY_CARE_PROVIDER_SITE_OTHER): Payer: Medicare Other | Admitting: *Deleted

## 2016-11-27 DIAGNOSIS — Z952 Presence of prosthetic heart valve: Secondary | ICD-10-CM

## 2016-11-27 DIAGNOSIS — Z5181 Encounter for therapeutic drug level monitoring: Secondary | ICD-10-CM

## 2016-11-27 DIAGNOSIS — Z7901 Long term (current) use of anticoagulants: Secondary | ICD-10-CM

## 2016-11-27 LAB — POCT INR: INR: 1.5

## 2016-12-02 ENCOUNTER — Other Ambulatory Visit: Payer: Self-pay | Admitting: Cardiovascular Disease

## 2016-12-11 ENCOUNTER — Ambulatory Visit (INDEPENDENT_AMBULATORY_CARE_PROVIDER_SITE_OTHER): Payer: Medicare Other | Admitting: *Deleted

## 2016-12-11 ENCOUNTER — Other Ambulatory Visit: Payer: Self-pay | Admitting: Cardiovascular Disease

## 2016-12-11 DIAGNOSIS — Z7901 Long term (current) use of anticoagulants: Secondary | ICD-10-CM

## 2016-12-11 DIAGNOSIS — Z952 Presence of prosthetic heart valve: Secondary | ICD-10-CM

## 2016-12-11 DIAGNOSIS — Z5181 Encounter for therapeutic drug level monitoring: Secondary | ICD-10-CM

## 2016-12-11 LAB — POCT INR: INR: 2.7

## 2017-01-03 ENCOUNTER — Encounter: Payer: Self-pay | Admitting: *Deleted

## 2017-01-09 ENCOUNTER — Other Ambulatory Visit: Payer: Self-pay | Admitting: Cardiovascular Disease

## 2017-01-09 ENCOUNTER — Other Ambulatory Visit: Payer: Self-pay | Admitting: Adult Health

## 2017-02-20 ENCOUNTER — Other Ambulatory Visit: Payer: Self-pay | Admitting: Cardiovascular Disease

## 2017-02-20 MED ORDER — DIGOXIN 125 MCG PO TABS: 62.5000 ug | ORAL_TABLET | Freq: Every day | ORAL | 6 refills | Status: DC

## 2017-02-20 NOTE — Telephone Encounter (Signed)
°*  STAT* If patient is at the pharmacy, call can be transferred to refill team.   1. Which medications need to be refilled? (please list name of each medication and dose if known)  DIGOX 125 MCG tablet [[id number]]    2. Which pharmacy/location (including street and city if local pharmacy) is medication to be sent to? [facility]   3. Do they need a 30 day or 90 day supply?   Pt is scheduled for an apt on 02/28/17 to see Dr. Kirtland Bouchard, he's completley out of this med

## 2017-02-20 NOTE — Telephone Encounter (Signed)
Refilled digoxin as requested  

## 2017-02-21 ENCOUNTER — Ambulatory Visit (INDEPENDENT_AMBULATORY_CARE_PROVIDER_SITE_OTHER): Payer: Medicare Other | Admitting: *Deleted

## 2017-02-21 DIAGNOSIS — Z5181 Encounter for therapeutic drug level monitoring: Secondary | ICD-10-CM

## 2017-02-21 DIAGNOSIS — Z7901 Long term (current) use of anticoagulants: Secondary | ICD-10-CM | POA: Diagnosis not present

## 2017-02-21 DIAGNOSIS — Z952 Presence of prosthetic heart valve: Secondary | ICD-10-CM | POA: Diagnosis not present

## 2017-02-21 LAB — POCT INR: INR: 1.1

## 2017-02-21 MED ORDER — DIGOXIN 125 MCG PO TABS: 62.5000 ug | ORAL_TABLET | Freq: Every day | ORAL | 0 refills | Status: DC

## 2017-02-28 ENCOUNTER — Other Ambulatory Visit (HOSPITAL_COMMUNITY)
Admission: RE | Admit: 2017-02-28 | Discharge: 2017-02-28 | Disposition: A | Discharge status: Home / Self Care | Payer: Medicare Other | Source: Ambulatory Visit | Attending: Cardiovascular Disease | Admitting: Cardiovascular Disease

## 2017-02-28 ENCOUNTER — Encounter: Payer: Self-pay | Admitting: Cardiovascular Disease

## 2017-02-28 ENCOUNTER — Ambulatory Visit: Payer: Self-pay | Admitting: Cardiovascular Disease

## 2017-02-28 ENCOUNTER — Ambulatory Visit (INDEPENDENT_AMBULATORY_CARE_PROVIDER_SITE_OTHER): Payer: Medicare Other | Admitting: Cardiovascular Disease

## 2017-02-28 VITALS — BP 140/94 | HR 63 | Ht 73.0 in | Wt 235.0 lb

## 2017-02-28 DIAGNOSIS — E782 Mixed hyperlipidemia: Secondary | ICD-10-CM | POA: Diagnosis not present

## 2017-02-28 DIAGNOSIS — Z952 Presence of prosthetic heart valve: Secondary | ICD-10-CM | POA: Diagnosis not present

## 2017-02-28 DIAGNOSIS — I10 Essential (primary) hypertension: Secondary | ICD-10-CM

## 2017-02-28 DIAGNOSIS — Z7901 Long term (current) use of anticoagulants: Secondary | ICD-10-CM

## 2017-02-28 DIAGNOSIS — Z9889 Other specified postprocedural states: Secondary | ICD-10-CM

## 2017-02-28 LAB — LIPID PANEL
CHOL/HDL RATIO: 2.7 ratio
Cholesterol: 168 mg/dL (ref 0–200)
HDL: 62 mg/dL (ref >40)
LDL Cholesterol: 83 mg/dL (ref 0–99)
Triglycerides: 117 mg/dL (ref <150)
VLDL: 23 mg/dL (ref 0–40)

## 2017-02-28 MED ORDER — AMLODIPINE BESYLATE 10 MG PO TABS: 10.0000 mg | ORAL_TABLET | Freq: Every day | ORAL | 3 refills | Status: DC

## 2017-02-28 NOTE — Addendum Note (Signed)
Addended by: Marlyn Corporal A on: 02/28/2017 02:00 PM   Modules accepted: Orders

## 2017-02-28 NOTE — Patient Instructions (Signed)
Your physician wants you to follow-up in:  1 year with Dr Reggy Eye will receive a reminder letter in the mail two months in advance. If you don't receive a letter, please call our office to schedule the follow-up appointment.     INCREASE Amlodipine to 10 mg daily     Your physician recommends that you return for lab work in: Lipids      If you need a refill on your cardiac medications before your next appointment, please call your pharmacy.       Thank you for choosing [facility] !

## 2017-02-28 NOTE — Progress Notes (Signed)
SUBJECTIVE: The patient presents for routine follow-up. He has a past medical history significant for aortic valve replacement (St. 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   He also has hypertension and hyperlipidemia.   His cardiac catheterization prior to his surgery reportedly revealed normal coronary arteries with a dominant left circumflex coronary artery.   He had an echocardiogram in January 2014 which showed normal left ventricular systolic function with an ejection fraction of 50-55%, a normally functioning prosthetic aortic valve with velocities of 2.5 m/s, and moderate left ventricular hypertrophy.  He denies chest pain, palpitations, leg swelling, orthopnea, PND, and shortness of breath.  He does not have a primary care physician.  He has not had his lipids checked.  He has occasional headaches.   Review of Systems: As per "subjective", otherwise negative.  Allergies  Allergen Reactions  . Iohexol Hives, Itching and Swelling    Extremity swelling and hives  . Aspirin     On coumadin  . Contrast Media [Iodinated Diagnostic Agents]     Current Outpatient Prescriptions  Medication Sig Dispense Refill  . amLODipine (NORVASC) 5 MG tablet TAKE ONE TABLET BY MOUTH DAILY. 30 tablet 11  . carvedilol (COREG) 25 MG tablet TAKE ONE TABLET BY MOUTH ONCE DAILY. 30 tablet 6  . digoxin (DIGOX) 0.125 MG tablet Take 0.5 tablets (62.5 mcg total) by mouth daily. 30 tablet 0  . folic acid (FOLVITE) 1 MG tablet TAKE ONE TABLET BY MOUTH DAILY. 30 tablet 6  . HYDROcodone-acetaminophen (NORCO) 10-325 MG per tablet Take 1 tablet by mouth every 4 (four) hours as needed for pain.    . ramipril (ALTACE) 10 MG capsule TAKE 1 CAPSULE BY MOUTH DAILY. 90 capsule 3  . simvastatin (ZOCOR) 40 MG tablet TAKE ONE TABLET BY MOUTH DAILY AT BEDTIME. 30 tablet 11  . tamsulosin  (FLOMAX) 0.4 MG CAPS capsule TAKE ONE CAPSULE BY MOUTH AT BEDTIME. 90 capsule 3  . triamterene-hydrochlorothiazide (MAXZIDE-25) 37.5-25 MG tablet TAKE ONE TABLET BY MOUTH THREE TIMES WEEKLY. 36 tablet 3  . warfarin (COUMADIN) 5 MG tablet Take 2 tablets daily except 1 1/2 tablets on Sundays and Thursdays 60 tablet 0   No current facility-administered medications for this visit.     Past Medical History:  Diagnosis Date  . Coronary artery disease   . Hypertension   . Poor historian   . Sexual dysfunction     Past Surgical History:  Procedure Laterality Date  . CIRCUMCISION  2006   [place]--which was uncomplicated  . CORONARY ANGIOPLASTY WITH STENT PLACEMENT    . EXPLORATION POST OPERATIVE OPEN HEART     open heart surgery    Social History   Social History  . Marital status: Divorced    Spouse name: N/A  . Number of children: N/A  . Years of education: N/A   Occupational History  . Not on file.   Social History Main Topics  . Smoking status: Never Smoker  . Smokeless tobacco: Never Used  . Alcohol use No  . Drug use: Yes    Types: Marijuana     Comment: last use 1 month ago  . Sexual activity: Yes    Birth control/ protection: None   Other Topics Concern  . Not on file   Social History Narrative  . No narrative on file     Vitals:   02/28/17  1338  BP: (!) 140/94  Pulse: 63  SpO2: 98%  Weight: 235 lb (106.6 kg)  Height: 6\' 1"  (1.854 m)    Wt Readings from Last 3 Encounters:  02/28/17 235 lb (106.6 kg)  07/15/16 240 lb 15.4 oz (109.3 kg)  01/26/16 268 lb (121.6 kg)     PHYSICAL EXAM General: NAD HEENT: Normal. Neck: No JVD, no thyromegaly. Lungs: Clear to auscultation bilaterally with normal respiratory effort. CV: Nondisplaced PMI. Regular S1/prosthetic S2 click, no S3/S4, I/VI systolic murmur best appreciated at RUSB (unchanged). No pretibial or periankle edema.  No carotid bruit.   Abdomen: Soft, nontender, no distention.  Neurologic:  Alert and oriented.  Psych: Normal affect. Skin: Normal. Musculoskeletal: No gross deformities.    ECG: Most recent ECG reviewed.   Labs: Lab Results  Component Value Date/Time   K 3.3 (L) 07/15/2016 07:00 AM   BUN 9 07/15/2016 07:00 AM   CREATININE 0.86 07/15/2016 07:00 AM   CREATININE 0.98 10/27/2014 10:31 AM   ALT 16 (L) 07/14/2016 09:35 PM   TSH 1.266 06/17/2013 09:21 AM   HGB 11.2 (L) 07/14/2016 09:35 PM     Lipids: Lab Results  Component Value Date/Time   LDLCALC 89 10/27/2014 10:31 AM   CHOL 158 10/27/2014 10:31 AM   TRIG 141 10/27/2014 10:31 AM   HDL 41 10/27/2014 10:31 AM       ASSESSMENT AND PLAN: 1. S/p AVR and aortic root repair: Normal velocities in January 2014, with no diastolic murmur appreciated. Continue warfarin for anticoagulation.  2. Essential HTN: Elevated. I will increase amlodipine to 10 mg daily.  3. Hyperlipidemia: Continue simvastatin 40 mg. I will check lipids.  4. I encouraged him to obtain a PCP.   Disposition: Follow up 1 year.  Prentice Docker, M.D., F.A.C.C.

## 2017-03-02 ENCOUNTER — Telehealth: Payer: Self-pay

## 2017-03-02 NOTE — Telephone Encounter (Signed)
-----   Message from Laqueta Linden, MD sent at 02/28/2017  3:30 PM EDT ----- Good results.

## 2017-03-02 NOTE — Telephone Encounter (Signed)
Called pt, left message for pt. To return call. Mailed pt copy of labs.

## 2017-03-14 ENCOUNTER — Encounter: Payer: Self-pay | Admitting: *Deleted

## 2017-03-16 ENCOUNTER — Ambulatory Visit: Payer: Self-pay | Admitting: Cardiovascular Disease

## 2017-03-24 ENCOUNTER — Other Ambulatory Visit: Payer: Self-pay | Admitting: Adult Health

## 2017-04-02 ENCOUNTER — Ambulatory Visit (INDEPENDENT_AMBULATORY_CARE_PROVIDER_SITE_OTHER): Payer: Medicare Other | Admitting: *Deleted

## 2017-04-02 DIAGNOSIS — Z952 Presence of prosthetic heart valve: Secondary | ICD-10-CM | POA: Diagnosis not present

## 2017-04-02 DIAGNOSIS — Z5181 Encounter for therapeutic drug level monitoring: Secondary | ICD-10-CM | POA: Diagnosis not present

## 2017-04-02 DIAGNOSIS — Z7901 Long term (current) use of anticoagulants: Secondary | ICD-10-CM

## 2017-04-02 LAB — POCT INR: INR: 2.2

## 2017-04-23 ENCOUNTER — Other Ambulatory Visit: Payer: Self-pay | Admitting: Adult Health

## 2017-04-23 ENCOUNTER — Ambulatory Visit (INDEPENDENT_AMBULATORY_CARE_PROVIDER_SITE_OTHER): Payer: Medicare Other | Admitting: *Deleted

## 2017-04-23 ENCOUNTER — Other Ambulatory Visit: Payer: Self-pay | Admitting: Cardiovascular Disease

## 2017-04-23 DIAGNOSIS — Z7901 Long term (current) use of anticoagulants: Secondary | ICD-10-CM | POA: Diagnosis not present

## 2017-04-23 DIAGNOSIS — Z952 Presence of prosthetic heart valve: Secondary | ICD-10-CM | POA: Diagnosis not present

## 2017-04-23 DIAGNOSIS — Z5181 Encounter for therapeutic drug level monitoring: Secondary | ICD-10-CM

## 2017-04-23 LAB — POCT INR: INR: 3.8

## 2017-04-26 ENCOUNTER — Other Ambulatory Visit: Payer: Self-pay | Admitting: Cardiovascular Disease

## 2017-05-14 ENCOUNTER — Encounter: Payer: Self-pay | Admitting: *Deleted

## 2017-06-06 ENCOUNTER — Other Ambulatory Visit: Payer: Self-pay | Admitting: Adult Health

## 2017-06-13 ENCOUNTER — Ambulatory Visit (INDEPENDENT_AMBULATORY_CARE_PROVIDER_SITE_OTHER): Payer: Medicare Other | Admitting: *Deleted

## 2017-06-13 DIAGNOSIS — Z952 Presence of prosthetic heart valve: Secondary | ICD-10-CM | POA: Diagnosis not present

## 2017-06-13 DIAGNOSIS — Z7901 Long term (current) use of anticoagulants: Secondary | ICD-10-CM | POA: Diagnosis not present

## 2017-06-13 DIAGNOSIS — Z5181 Encounter for therapeutic drug level monitoring: Secondary | ICD-10-CM | POA: Diagnosis not present

## 2017-06-13 LAB — POCT INR: INR: 4.8

## 2017-06-25 ENCOUNTER — Other Ambulatory Visit: Payer: Self-pay | Admitting: Adult Health

## 2017-06-25 ENCOUNTER — Other Ambulatory Visit: Payer: Self-pay | Admitting: Cardiovascular Disease

## 2017-06-27 ENCOUNTER — Ambulatory Visit (INDEPENDENT_AMBULATORY_CARE_PROVIDER_SITE_OTHER): Payer: Medicare Other | Admitting: *Deleted

## 2017-06-27 DIAGNOSIS — Z952 Presence of prosthetic heart valve: Secondary | ICD-10-CM

## 2017-06-27 DIAGNOSIS — Z7901 Long term (current) use of anticoagulants: Secondary | ICD-10-CM

## 2017-06-27 DIAGNOSIS — Z5181 Encounter for therapeutic drug level monitoring: Secondary | ICD-10-CM

## 2017-06-27 LAB — POCT INR: INR: 2

## 2017-07-06 ENCOUNTER — Other Ambulatory Visit: Payer: Self-pay | Admitting: Adult Health

## 2017-07-11 ENCOUNTER — Encounter: Payer: Self-pay | Admitting: *Deleted

## 2017-07-21 ENCOUNTER — Other Ambulatory Visit: Payer: Self-pay | Admitting: Adult Health

## 2017-08-01 ENCOUNTER — Ambulatory Visit (INDEPENDENT_AMBULATORY_CARE_PROVIDER_SITE_OTHER): Payer: Medicare Other | Admitting: *Deleted

## 2017-08-01 DIAGNOSIS — Z5181 Encounter for therapeutic drug level monitoring: Secondary | ICD-10-CM

## 2017-08-01 DIAGNOSIS — Z952 Presence of prosthetic heart valve: Secondary | ICD-10-CM

## 2017-08-01 DIAGNOSIS — Z7901 Long term (current) use of anticoagulants: Secondary | ICD-10-CM

## 2017-08-01 LAB — POCT INR: INR: 4.8

## 2017-08-15 ENCOUNTER — Ambulatory Visit (INDEPENDENT_AMBULATORY_CARE_PROVIDER_SITE_OTHER): Payer: Medicare Other | Admitting: *Deleted

## 2017-08-15 DIAGNOSIS — Z7901 Long term (current) use of anticoagulants: Secondary | ICD-10-CM | POA: Diagnosis not present

## 2017-08-15 DIAGNOSIS — Z5181 Encounter for therapeutic drug level monitoring: Secondary | ICD-10-CM

## 2017-08-15 DIAGNOSIS — Z952 Presence of prosthetic heart valve: Secondary | ICD-10-CM | POA: Diagnosis not present

## 2017-08-15 LAB — POCT INR: INR: 2.1

## 2017-09-28 ENCOUNTER — Ambulatory Visit (INDEPENDENT_AMBULATORY_CARE_PROVIDER_SITE_OTHER): Payer: Medicare Other | Admitting: *Deleted

## 2017-09-28 DIAGNOSIS — Z952 Presence of prosthetic heart valve: Secondary | ICD-10-CM

## 2017-09-28 DIAGNOSIS — Z5181 Encounter for therapeutic drug level monitoring: Secondary | ICD-10-CM

## 2017-09-28 DIAGNOSIS — Z7901 Long term (current) use of anticoagulants: Secondary | ICD-10-CM | POA: Diagnosis not present

## 2017-09-28 LAB — POCT INR: INR: 1.6

## 2017-09-28 NOTE — Progress Notes (Signed)
Take 3 tablets tonight and tomorrow night then resume 2 tablets daily except 1 tablet on Sundays and Thursdays Recheck in 3 weeks

## 2017-11-02 ENCOUNTER — Ambulatory Visit (INDEPENDENT_AMBULATORY_CARE_PROVIDER_SITE_OTHER): Payer: Medicare Other | Admitting: *Deleted

## 2017-11-02 DIAGNOSIS — Z952 Presence of prosthetic heart valve: Secondary | ICD-10-CM | POA: Diagnosis not present

## 2017-11-02 DIAGNOSIS — Z7901 Long term (current) use of anticoagulants: Secondary | ICD-10-CM

## 2017-11-02 DIAGNOSIS — Z5181 Encounter for therapeutic drug level monitoring: Secondary | ICD-10-CM | POA: Diagnosis not present

## 2017-11-02 LAB — POCT INR: INR: 3.1

## 2017-11-28 ENCOUNTER — Ambulatory Visit (INDEPENDENT_AMBULATORY_CARE_PROVIDER_SITE_OTHER): Payer: Medicare Other | Admitting: *Deleted

## 2017-11-28 DIAGNOSIS — Z5181 Encounter for therapeutic drug level monitoring: Secondary | ICD-10-CM | POA: Diagnosis not present

## 2017-11-28 DIAGNOSIS — Z952 Presence of prosthetic heart valve: Secondary | ICD-10-CM

## 2017-11-28 DIAGNOSIS — Z7901 Long term (current) use of anticoagulants: Secondary | ICD-10-CM | POA: Diagnosis not present

## 2017-11-28 LAB — POCT INR: INR: 2.1

## 2017-11-28 NOTE — Patient Instructions (Signed)
Take coumadin 3 tablets tonight then resume 2 tablets daily except 1 tablet on Sundays and Thursdays Recheck in 3 weeks

## 2017-12-13 ENCOUNTER — Other Ambulatory Visit: Payer: Self-pay | Admitting: Cardiovascular Disease

## 2017-12-19 ENCOUNTER — Ambulatory Visit (INDEPENDENT_AMBULATORY_CARE_PROVIDER_SITE_OTHER): Payer: Medicare Other | Admitting: *Deleted

## 2017-12-19 DIAGNOSIS — Z952 Presence of prosthetic heart valve: Secondary | ICD-10-CM

## 2017-12-19 DIAGNOSIS — Z7901 Long term (current) use of anticoagulants: Secondary | ICD-10-CM | POA: Diagnosis not present

## 2017-12-19 DIAGNOSIS — Z5181 Encounter for therapeutic drug level monitoring: Secondary | ICD-10-CM | POA: Diagnosis not present

## 2017-12-19 LAB — POCT INR: INR: 2.2

## 2017-12-19 NOTE — Patient Instructions (Signed)
Increase coumadin to 2 tablets daily except 1 tablet on Sundays  Recheck in 3 weeks

## 2018-01-09 ENCOUNTER — Ambulatory Visit (INDEPENDENT_AMBULATORY_CARE_PROVIDER_SITE_OTHER): Payer: Medicare Other | Admitting: *Deleted

## 2018-01-09 DIAGNOSIS — Z5181 Encounter for therapeutic drug level monitoring: Secondary | ICD-10-CM

## 2018-01-09 DIAGNOSIS — Z952 Presence of prosthetic heart valve: Secondary | ICD-10-CM | POA: Diagnosis not present

## 2018-01-09 DIAGNOSIS — Z7901 Long term (current) use of anticoagulants: Secondary | ICD-10-CM | POA: Diagnosis not present

## 2018-01-09 LAB — POCT INR: INR: 1.8

## 2018-01-09 NOTE — Patient Instructions (Signed)
Increase coumadin to 2 tablets daily  Recheck in 2 weeks

## 2018-01-23 ENCOUNTER — Ambulatory Visit (INDEPENDENT_AMBULATORY_CARE_PROVIDER_SITE_OTHER): Payer: Medicare Other | Admitting: *Deleted

## 2018-01-23 DIAGNOSIS — Z952 Presence of prosthetic heart valve: Secondary | ICD-10-CM

## 2018-01-23 DIAGNOSIS — Z5181 Encounter for therapeutic drug level monitoring: Secondary | ICD-10-CM | POA: Diagnosis not present

## 2018-01-23 DIAGNOSIS — Z7901 Long term (current) use of anticoagulants: Secondary | ICD-10-CM

## 2018-01-23 LAB — POCT INR: INR: 2.9

## 2018-01-23 MED ORDER — WARFARIN SODIUM 5 MG PO TABS: ORAL_TABLET | ORAL | 3 refills | Status: DC

## 2018-01-23 NOTE — Patient Instructions (Signed)
Continue coumadin 2 tablets daily. Recheck in 4 weeks. 

## 2018-02-05 ENCOUNTER — Other Ambulatory Visit: Payer: Self-pay | Admitting: Cardiovascular Disease

## 2018-02-14 ENCOUNTER — Other Ambulatory Visit: Payer: Self-pay | Admitting: Cardiovascular Disease

## 2018-03-27 ENCOUNTER — Ambulatory Visit (INDEPENDENT_AMBULATORY_CARE_PROVIDER_SITE_OTHER): Payer: Medicare Other | Admitting: *Deleted

## 2018-03-27 DIAGNOSIS — Z5181 Encounter for therapeutic drug level monitoring: Secondary | ICD-10-CM | POA: Diagnosis not present

## 2018-03-27 DIAGNOSIS — Z952 Presence of prosthetic heart valve: Secondary | ICD-10-CM | POA: Diagnosis not present

## 2018-03-27 DIAGNOSIS — Z7901 Long term (current) use of anticoagulants: Secondary | ICD-10-CM | POA: Diagnosis not present

## 2018-03-27 LAB — POCT INR: INR: 5.5

## 2018-03-27 NOTE — Patient Instructions (Signed)
Hold coumadin tonight and tomorrow night then resume 2 tablets daily  Recheck in 3 weeks

## 2018-04-08 ENCOUNTER — Other Ambulatory Visit: Payer: Self-pay | Admitting: *Deleted

## 2018-04-08 MED ORDER — AMLODIPINE BESYLATE 10 MG PO TABS: 10.0000 mg | ORAL_TABLET | Freq: Every day | ORAL | 0 refills | Status: DC

## 2018-04-19 ENCOUNTER — Ambulatory Visit (INDEPENDENT_AMBULATORY_CARE_PROVIDER_SITE_OTHER): Payer: Medicare Other | Admitting: *Deleted

## 2018-04-19 DIAGNOSIS — Z952 Presence of prosthetic heart valve: Secondary | ICD-10-CM | POA: Diagnosis not present

## 2018-04-19 DIAGNOSIS — Z7901 Long term (current) use of anticoagulants: Secondary | ICD-10-CM

## 2018-04-19 DIAGNOSIS — Z5181 Encounter for therapeutic drug level monitoring: Secondary | ICD-10-CM | POA: Diagnosis not present

## 2018-04-19 LAB — POCT INR: INR: 2.2 (ref 2.0–3.0)

## 2018-04-19 MED ORDER — SIMVASTATIN 40 MG PO TABS: 40.0000 mg | ORAL_TABLET | Freq: Every day | ORAL | 0 refills | Status: DC

## 2018-04-19 NOTE — Patient Instructions (Signed)
Continue coumadin 2 tablets daily. Recheck in 4 weeks. 

## 2018-04-26 ENCOUNTER — Other Ambulatory Visit: Payer: Self-pay | Admitting: Cardiovascular Disease

## 2018-04-29 ENCOUNTER — Encounter: Payer: Self-pay | Admitting: Cardiovascular Disease

## 2018-04-29 ENCOUNTER — Ambulatory Visit (INDEPENDENT_AMBULATORY_CARE_PROVIDER_SITE_OTHER): Payer: Medicare Other | Admitting: Cardiovascular Disease

## 2018-04-29 ENCOUNTER — Other Ambulatory Visit (HOSPITAL_COMMUNITY)
Admission: RE | Admit: 2018-04-29 | Discharge: 2018-04-29 | Disposition: A | Discharge status: Home / Self Care | Payer: Medicare Other | Source: Ambulatory Visit | Attending: Cardiovascular Disease | Admitting: Cardiovascular Disease

## 2018-04-29 VITALS — BP 128/80 | HR 54 | Ht 73.0 in | Wt 230.0 lb

## 2018-04-29 DIAGNOSIS — Z7901 Long term (current) use of anticoagulants: Secondary | ICD-10-CM

## 2018-04-29 DIAGNOSIS — Z9889 Other specified postprocedural states: Secondary | ICD-10-CM

## 2018-04-29 DIAGNOSIS — Z5181 Encounter for therapeutic drug level monitoring: Secondary | ICD-10-CM

## 2018-04-29 DIAGNOSIS — I429 Cardiomyopathy, unspecified: Secondary | ICD-10-CM

## 2018-04-29 DIAGNOSIS — E785 Hyperlipidemia, unspecified: Secondary | ICD-10-CM | POA: Insufficient documentation

## 2018-04-29 DIAGNOSIS — I10 Essential (primary) hypertension: Secondary | ICD-10-CM | POA: Insufficient documentation

## 2018-04-29 DIAGNOSIS — Z952 Presence of prosthetic heart valve: Secondary | ICD-10-CM | POA: Diagnosis not present

## 2018-04-29 LAB — COMPREHENSIVE METABOLIC PANEL
ALT: 15 U/L — AB (ref 17–63)
AST: 22 U/L (ref 15–41)
Albumin: 4 g/dL (ref 3.5–5.0)
Alkaline Phosphatase: 60 U/L (ref 38–126)
Anion gap: 7 (ref 5–15)
BUN: 24 mg/dL — AB (ref 6–20)
CO2: 25 mmol/L (ref 22–32)
CREATININE: 1.11 mg/dL (ref 0.61–1.24)
Calcium: 8.8 mg/dL — ABNORMAL LOW (ref 8.9–10.3)
Chloride: 107 mmol/L (ref 101–111)
Glucose, Bld: 127 mg/dL — ABNORMAL HIGH (ref 65–99)
Potassium: 3.9 mmol/L (ref 3.5–5.1)
Sodium: 139 mmol/L (ref 135–145)
TOTAL PROTEIN: 7.5 g/dL (ref 6.5–8.1)
Total Bilirubin: 0.7 mg/dL (ref 0.3–1.2)

## 2018-04-29 LAB — CBC WITH DIFFERENTIAL/PLATELET
BASOS ABS: 0 10*3/uL (ref 0.0–0.1)
BASOS PCT: 0 %
EOS ABS: 0.1 10*3/uL (ref 0.0–0.7)
Eosinophils Relative: 3 %
HCT: 37.6 % — ABNORMAL LOW (ref 39.0–52.0)
HEMOGLOBIN: 12.2 g/dL — AB (ref 13.0–17.0)
Lymphocytes Relative: 26 %
Lymphs Abs: 1.1 10*3/uL (ref 0.7–4.0)
MCH: 25.5 pg — ABNORMAL LOW (ref 26.0–34.0)
MCHC: 32.4 g/dL (ref 30.0–36.0)
MCV: 78.7 fL (ref 78.0–100.0)
MONO ABS: 0.4 10*3/uL (ref 0.1–1.0)
MONOS PCT: 10 %
NEUTROS ABS: 2.6 10*3/uL (ref 1.7–7.7)
NEUTROS PCT: 61 %
Platelets: 183 10*3/uL (ref 150–400)
RBC: 4.78 MIL/uL (ref 4.22–5.81)
RDW: 15.7 % — AB (ref 11.5–15.5)
WBC: 4.2 10*3/uL (ref 4.0–10.5)

## 2018-04-29 LAB — LIPID PANEL
Cholesterol: 180 mg/dL (ref 0–200)
HDL: 72 mg/dL (ref >40)
LDL Cholesterol: 95 mg/dL (ref 0–99)
TRIGLYCERIDES: 67 mg/dL (ref <150)
Total CHOL/HDL Ratio: 2.5 RATIO
VLDL: 13 mg/dL (ref 0–40)

## 2018-04-29 NOTE — Patient Instructions (Signed)
Medication Instructions:  Your physician has recommended you make the following change in your medication:  Stop Taking Digoxin    Labwork: Your physician recommends that you return for lab work in: Today    Testing/Procedures: NONE   Follow-Up: Your physician wants you to follow-up in: 1 Year with Dr. Purvis Sheffield.  You will receive a reminder letter in the mail two months in advance. If you don't receive a letter, please call our office to schedule the follow-up appointment.   Any Other Special Instructions Will Be Listed Below (If Applicable). You have been given a list of local PCP's     If you need a refill on your cardiac medications before your next appointment, please call your pharmacy. Thank you for choosing [facility]!

## 2018-04-29 NOTE — Progress Notes (Signed)
SUBJECTIVE: The patient presents for routine follow-up. He has a past medical history significant for aortic valve replacement (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   He also has hypertension and hyperlipidemia.   His cardiac catheterization prior to his surgery reportedly revealed normal coronary arteries with a dominant left circumflex coronary artery.   The patient denies any symptoms of chest pain, palpitations, shortness of breath, lightheadedness, dizziness, leg swelling, orthopnea, PND, and syncope.  He still does not have a PCP.  Most recent echocardiogram performed on 07/16/2016 demonstrated mildly reduced left ventricular systolic function, LVEF 45 to 40%, moderate LVH, normal regional wall motion, normally functioning tilting-disc aortic valve prosthesis, moderate biatrial dilatation, and mildly dilated right ventricle.  He denies bleeding problems as well with warfarin.     Review of Systems: As per "subjective", otherwise negative.  Allergies  Allergen Reactions  . Iohexol Hives, Itching and Swelling    Extremity swelling and hives  . Aspirin     On coumadin  . Contrast Media [Iodinated Diagnostic Agents]     Current Outpatient Medications  Medication Sig Dispense Refill  . amLODipine (NORVASC) 10 MG tablet Take 1 tablet (10 mg total) by mouth daily. 30 tablet 0  . carvedilol (COREG) 25 MG tablet TAKE ONE TABLET BY MOUTH ONCE DAILY. 30 tablet 6  . DIGOX 125 MCG tablet TAKE 1/2 TABLET BY MOUTH ONCE DAILY. 30 tablet 0  . folic acid (FOLVITE) 1 MG tablet TAKE ONE TABLET BY MOUTH DAILY. 30 tablet 6  . HYDROcodone-acetaminophen (NORCO) 10-325 MG per tablet Take 1 tablet by mouth every 4 (four) hours as needed for pain.    . ramipril (ALTACE) 10 MG capsule TAKE 1 CAPSULE BY MOUTH DAILY. 90 capsule 3  . simvastatin (ZOCOR) 40 MG tablet Take 1  tablet (40 mg total) by mouth at bedtime. 30 tablet 0  . tamsulosin (FLOMAX) 0.4 MG CAPS capsule TAKE ONE CAPSULE BY MOUTH AT BEDTIME. 90 capsule 3  . triamterene-hydrochlorothiazide (MAXZIDE-25) 37.5-25 MG tablet TAKE ONE TABLET BY MOUTH THREE TIMES WEEKLY. 25 tablet 11  . warfarin (COUMADIN) 5 MG tablet TAKE 2 TABLETS BY MOUTH DAILY 60 tablet 3   No current facility-administered medications for this visit.     Past Medical History:  Diagnosis Date  . Coronary artery disease   . Hypertension   . Poor historian   . Sexual dysfunction     Past Surgical History:  Procedure Laterality Date  . CIRCUMCISION  2006   [place]--which was uncomplicated  . CORONARY ANGIOPLASTY WITH STENT PLACEMENT    . EXPLORATION POST OPERATIVE OPEN HEART     open heart surgery    Social History   Socioeconomic History  . Marital status: Divorced    Spouse name: Not on file  . Number of children: Not on file  . Years of education: Not on file  . Highest education level: Not on file  Occupational History  . Not on file  Social Needs  . Financial resource strain: Not on file  . Food insecurity:    Worry: Not on file    Inability: Not on file  . Transportation needs:    Medical: Not on file    Non-medical: Not on file  Tobacco Use  . Smoking status: Never Smoker  . Smokeless tobacco: Never Used  Substance and Sexual Activity  . Alcohol  use: No  . Drug use: Yes    Types: Marijuana    Comment: last use 1 month ago  . Sexual activity: Yes    Birth control/protection: None  Lifestyle  . Physical activity:    Days per week: Not on file    Minutes per session: Not on file  . Stress: Not on file  Relationships  . Social connections:    Talks on phone: Not on file    Gets together: Not on file    Attends religious service: Not on file    Active member of club or organization: Not on file    Attends meetings of clubs or organizations: Not on file    Relationship status: Not on file  .  Intimate partner violence:    Fear of current or ex partner: Not on file    Emotionally abused: Not on file    Physically abused: Not on file    Forced sexual activity: Not on file  Other Topics Concern  . Not on file  Social History Narrative  . Not on file     Vitals:   04/29/18 0946  BP: 128/80  Pulse: (!) 54  SpO2: 97%  Weight: 230 lb (104.3 kg)  Height: 6\' 1"  (1.854 m)    Wt Readings from Last 3 Encounters:  04/29/18 230 lb (104.3 kg)  02/28/17 235 lb (106.6 kg)  07/15/16 240 lb 15.4 oz (109.3 kg)     PHYSICAL EXAM General: NAD HEENT: Normal. Neck: No JVD, no thyromegaly. Lungs: Clear to auscultation bilaterally with normal respiratory effort. CV: Regular rate and rhythm, normal S1/prosthetic S2 click, no S3/S4, I/VI systolic murmur best appreciated at RUSB (unchanged).  No pretibial or periankle edema.  No carotid bruit.   Abdomen: Soft, nontender, no distention.  Neurologic: Alert and oriented.  Psych: Normal affect. Skin: Normal. Musculoskeletal: No gross deformities.    ECG: Most recent ECG reviewed.   Labs: Lab Results  Component Value Date/Time   K 3.3 (L) 07/15/2016 07:00 AM   BUN 9 07/15/2016 07:00 AM   CREATININE 0.86 07/15/2016 07:00 AM   CREATININE 0.98 10/27/2014 10:31 AM   ALT 16 (L) 07/14/2016 09:35 PM   TSH 1.266 06/17/2013 09:21 AM   HGB 11.2 (L) 07/14/2016 09:35 PM     Lipids: Lab Results  Component Value Date/Time   LDLCALC 83 02/28/2017 02:14 PM   CHOL 168 02/28/2017 02:14 PM   TRIG 117 02/28/2017 02:14 PM   HDL 62 02/28/2017 02:14 PM       ASSESSMENT AND PLAN:  1. S/p AVR and aortic root repair:  Him to medically stable.  Normally functioning prosthetic aortic valve by echocardiogram in August 2017 as noted above, with no diastolic murmur appreciated. Continue warfarin for anticoagulation.  I will discontinue digoxin.  2. Essential HTN:  Pressure is controlled.  No changes to therapy.  3. Hyperlipidemia: Continue  simvastatin 40 mg. I will check lipids.  Lipids were normal in April 2018.  4.  Chronic anticoagulation: I will obtain a comprehensive metabolic panel and CBC.    5.  Cardiomyopathy: LVEF is mildly reduced as noted above.  I will discontinue digoxin.  Continue carvedilol and ramipril.  No evidence of heart failure.   Disposition: Follow up with me in 1 year.  I strongly advised him to obtain a PCP.   Prentice Docker, M.D., F.A.C.C.

## 2018-05-14 ENCOUNTER — Other Ambulatory Visit: Payer: Self-pay | Admitting: Cardiovascular Disease

## 2018-05-17 ENCOUNTER — Ambulatory Visit (INDEPENDENT_AMBULATORY_CARE_PROVIDER_SITE_OTHER): Payer: Medicare Other | Admitting: *Deleted

## 2018-05-17 DIAGNOSIS — Z7901 Long term (current) use of anticoagulants: Secondary | ICD-10-CM

## 2018-05-17 DIAGNOSIS — Z952 Presence of prosthetic heart valve: Secondary | ICD-10-CM | POA: Diagnosis not present

## 2018-05-17 DIAGNOSIS — Z5181 Encounter for therapeutic drug level monitoring: Secondary | ICD-10-CM | POA: Diagnosis not present

## 2018-05-17 LAB — POCT INR: INR: 2.1 (ref 2.0–3.0)

## 2018-05-17 MED ORDER — TRIAMTERENE-HCTZ 37.5-25 MG PO TABS: ORAL_TABLET | ORAL | 6 refills | Status: DC

## 2018-05-17 NOTE — Patient Instructions (Signed)
Increase coumadin to 2 tablets daily except 3 tablets on Fridays Recheck in 3 weeks

## 2018-05-22 ENCOUNTER — Other Ambulatory Visit: Payer: Self-pay | Admitting: Cardiovascular Disease

## 2018-05-27 ENCOUNTER — Other Ambulatory Visit: Payer: Self-pay | Admitting: Cardiovascular Disease

## 2018-06-07 ENCOUNTER — Ambulatory Visit (INDEPENDENT_AMBULATORY_CARE_PROVIDER_SITE_OTHER): Payer: Medicare Other | Admitting: *Deleted

## 2018-06-07 DIAGNOSIS — Z7901 Long term (current) use of anticoagulants: Secondary | ICD-10-CM

## 2018-06-07 DIAGNOSIS — Z5181 Encounter for therapeutic drug level monitoring: Secondary | ICD-10-CM | POA: Diagnosis not present

## 2018-06-07 DIAGNOSIS — Z952 Presence of prosthetic heart valve: Secondary | ICD-10-CM

## 2018-06-07 LAB — POCT INR: INR: 4.1 — AB (ref 2.0–3.0)

## 2018-06-07 NOTE — Patient Instructions (Signed)
Hold coumadin tonight then continue coumadin 2 tablets daily except 3 tablets on Fridays Recheck in 2 weeks

## 2018-06-24 ENCOUNTER — Ambulatory Visit (INDEPENDENT_AMBULATORY_CARE_PROVIDER_SITE_OTHER): Payer: Medicare Other | Admitting: Pharmacist

## 2018-06-24 DIAGNOSIS — Z7901 Long term (current) use of anticoagulants: Secondary | ICD-10-CM

## 2018-06-24 DIAGNOSIS — Z5181 Encounter for therapeutic drug level monitoring: Secondary | ICD-10-CM | POA: Diagnosis not present

## 2018-06-24 DIAGNOSIS — Z952 Presence of prosthetic heart valve: Secondary | ICD-10-CM

## 2018-06-24 LAB — POCT INR: INR: 5.1 — AB (ref 2.0–3.0)

## 2018-06-24 MED ORDER — AMLODIPINE BESYLATE 10 MG PO TABS: 10.0000 mg | ORAL_TABLET | Freq: Every day | ORAL | 3 refills | Status: DC

## 2018-06-24 NOTE — Patient Instructions (Signed)
Description   Hold coumadin tonight and tomorrow then change dose to 2 tablets daily.  Recheck in 1 weeks

## 2018-07-01 ENCOUNTER — Ambulatory Visit (INDEPENDENT_AMBULATORY_CARE_PROVIDER_SITE_OTHER): Payer: Medicare Other | Admitting: *Deleted

## 2018-07-01 DIAGNOSIS — Z5181 Encounter for therapeutic drug level monitoring: Secondary | ICD-10-CM

## 2018-07-01 DIAGNOSIS — Z952 Presence of prosthetic heart valve: Secondary | ICD-10-CM

## 2018-07-01 DIAGNOSIS — Z7901 Long term (current) use of anticoagulants: Secondary | ICD-10-CM | POA: Diagnosis not present

## 2018-07-01 LAB — POCT INR: INR: 1.8 — AB (ref 2.0–3.0)

## 2018-07-01 MED ORDER — SIMVASTATIN 40 MG PO TABS: 40.0000 mg | ORAL_TABLET | Freq: Every day | ORAL | 6 refills | Status: DC

## 2018-07-01 MED ORDER — WARFARIN SODIUM 5 MG PO TABS: ORAL_TABLET | ORAL | 2 refills | Status: DC

## 2018-07-01 NOTE — Patient Instructions (Signed)
Take coumadin 3 tablets tonight then increase dose back to 2 tablets daily except 3 tablets on Fridays  Recheck in 2 weeks

## 2018-07-29 ENCOUNTER — Ambulatory Visit (INDEPENDENT_AMBULATORY_CARE_PROVIDER_SITE_OTHER): Payer: Medicare Other | Admitting: *Deleted

## 2018-07-29 DIAGNOSIS — Z952 Presence of prosthetic heart valve: Secondary | ICD-10-CM | POA: Diagnosis not present

## 2018-07-29 DIAGNOSIS — Z5181 Encounter for therapeutic drug level monitoring: Secondary | ICD-10-CM

## 2018-07-29 DIAGNOSIS — Z7901 Long term (current) use of anticoagulants: Secondary | ICD-10-CM | POA: Diagnosis not present

## 2018-07-29 LAB — POCT INR: INR: 2.3 (ref 2.0–3.0)

## 2018-07-29 NOTE — Patient Instructions (Signed)
Increase coumadin to 2 tablets daily except 3 tablets on Mondays and Thursdays  Recheck in 3 weeks

## 2018-07-30 ENCOUNTER — Other Ambulatory Visit: Payer: Self-pay | Admitting: Cardiovascular Disease

## 2018-08-10 ENCOUNTER — Emergency Department (HOSPITAL_COMMUNITY)
Admission: EM | Admit: 2018-08-10 | Discharge: 2018-08-10 | Disposition: A | Discharge status: Home / Self Care | Payer: Medicare Other | Attending: Emergency Medicine | Admitting: Emergency Medicine

## 2018-08-10 ENCOUNTER — Other Ambulatory Visit: Payer: Self-pay

## 2018-08-10 ENCOUNTER — Emergency Department (HOSPITAL_COMMUNITY): Payer: Medicare Other

## 2018-08-10 DIAGNOSIS — Z7901 Long term (current) use of anticoagulants: Secondary | ICD-10-CM | POA: Insufficient documentation

## 2018-08-10 DIAGNOSIS — Z79899 Other long term (current) drug therapy: Secondary | ICD-10-CM | POA: Insufficient documentation

## 2018-08-10 DIAGNOSIS — I10 Essential (primary) hypertension: Secondary | ICD-10-CM | POA: Diagnosis not present

## 2018-08-10 DIAGNOSIS — M79601 Pain in right arm: Secondary | ICD-10-CM

## 2018-08-10 DIAGNOSIS — I259 Chronic ischemic heart disease, unspecified: Secondary | ICD-10-CM | POA: Diagnosis not present

## 2018-08-10 DIAGNOSIS — M7989 Other specified soft tissue disorders: Secondary | ICD-10-CM | POA: Diagnosis not present

## 2018-08-10 NOTE — ED Triage Notes (Signed)
Pt states that he has been having swelling in his right arm denies injury

## 2018-08-10 NOTE — ED Provider Notes (Signed)
[facility] EMERGENCY DEPARTMENT Provider Note   CSN: [id number] Arrival date & time: 08/10/18  1026     History   Chief Complaint Chief Complaint  Patient presents with  . Arm Pain    HPI [name] is a 67 y.o. male.  The history is provided by the patient. No language interpreter was used.  Arm Pain  This is a new problem. The current episode started more than 1 week ago. The problem occurs constantly. The problem has been gradually worsening. Nothing aggravates the symptoms. Nothing relieves the symptoms. He has tried nothing for the symptoms. The treatment provided no relief.  Pt reports his arm swelled after his flu shot.  Pt reports swelling resolved except for his right forearm.  Pt reports he has a knot   Past Medical History:  Diagnosis Date  . Coronary artery disease   . Hypertension   . Poor historian   . Sexual dysfunction     Patient Active Problem List   Diagnosis Date Noted  . Depression 07/16/2016  . Acute chest pain 07/15/2016  . Chest pain 07/15/2016  . MDD (major depressive disorder), single episode 07/15/2016  . Arm pain, right 10/23/2014  . Encounter for therapeutic drug monitoring 01/28/2014  . H/O aortic root repair 10/22/2013  . HTN (hypertension) 10/22/2013  . Hyperlipidemia 10/22/2013  . Long term (current) use of anticoagulants 03/07/2013  . Aortic valve replaced 03/07/2013    Past Surgical History:  Procedure Laterality Date  . CIRCUMCISION  2006   [place]--which was uncomplicated  . CORONARY ANGIOPLASTY WITH STENT PLACEMENT    . EXPLORATION POST OPERATIVE OPEN HEART     open heart surgery        Home Medications    Prior to Admission medications   Medication Sig Start Date End Date Taking? Authorizing Provider  amLODipine (NORVASC) 10 MG tablet Take 1 tablet (10 mg total) by mouth daily. 06/24/18   Laqueta Linden, MD  carvedilol (COREG) 25 MG tablet TAKE ONE TABLET BY MOUTH ONCE DAILY. 01/09/17   Laqueta Linden, MD  folic acid (FOLVITE) 1 MG tablet TAKE ONE TABLET BY MOUTH DAILY. 01/31/16   Laqueta Linden, MD  HYDROcodone-acetaminophen (NORCO) 10-325 MG per tablet Take 1 tablet by mouth every 4 (four) hours as needed for pain.    [provider]  ramipril (ALTACE) 10 MG capsule TAKE 1 CAPSULE BY MOUTH DAILY. 07/30/18   Laqueta Linden, MD  simvastatin (ZOCOR) 40 MG tablet Take 1 tablet (40 mg total) by mouth at bedtime. 07/01/18   Laqueta Linden, MD  tamsulosin (FLOMAX) 0.4 MG CAPS capsule TAKE ONE CAPSULE BY MOUTH AT BEDTIME. 02/14/18   Laqueta Linden, MD  triamterene-hydrochlorothiazide (MAXZIDE-25) 37.5-25 MG tablet TAKE ONE TABLET BY MOUTH THREE TIMES WEEKLY. 05/17/18   Laqueta Linden, MD  warfarin (COUMADIN) 5 MG tablet TAKE 2 TABLETS BY MOUTH DAILY EXCEPT 3 TABLETS ON FRIDAYS 07/01/18   Laqueta Linden, MD    Family History Family History  Problem Relation Age of Onset  . Hypertension Mother   . Diabetes Mother   . Alcoholism Father     Social History Social History   Tobacco Use  . Smoking status: Never Smoker  . Smokeless tobacco: Never Used  Substance Use Topics  . Alcohol use: No  . Drug use: Yes    Types: Marijuana    Comment: last use 1 month ago     Allergies   Iohexol; Aspirin;  and Contrast media [iodinated diagnostic agents]   Review of Systems Review of Systems  Musculoskeletal: Positive for myalgias.  All other systems reviewed and are negative.    Physical Exam Updated Vital Signs BP (!) 154/80   Pulse 65   Temp 98.2 F (36.8 C) (Oral)   Resp 12   Ht 6\' 1"  (1.854 m)   Wt 108.9 kg   SpO2 100%   BMI 31.66 kg/m   Physical Exam  Constitutional: He appears well-developed and well-nourished.  HENT:  Head: Normocephalic.  Cardiovascular: Regular rhythm.  Pulmonary/Chest: Effort normal.  Musculoskeletal: He exhibits tenderness.  Swollen mid forearm tender  From  nv and ns intact. 8cm area of firmness, feels like  a lipoma   Skin: Skin is warm.  Psychiatric: He has a normal mood and affect.  Nursing note and vitals reviewed.    ED Treatments / Results  Labs (all labs ordered are listed, but only abnormal results are displayed) Labs Reviewed - No data to display  EKG None  Radiology No results found.  Procedures Procedures (including critical care time)  Medications Ordered in ED Medications - No data to display   Initial Impression / Assessment and Plan / ED Course  I have reviewed the triage vital signs and the nursing notes.  Pertinent labs & imaging results that were available during my care of the patient were reviewed by me and considered in my medical decision making (see chart for details).     Pt advised to follow up with Dr. Romeo Apple for evaluations    Final Clinical Impressions(s) / ED Diagnoses   Final diagnoses:  Right arm pain    ED Discharge Orders    None    An After Visit Summary was printed and given to the patient.    Elson Areas, Cordelia Poche 08/10/18 1146    Eber Hong, MD 08/10/18 [phone]

## 2018-08-10 NOTE — ED Notes (Addendum)
Went in to review discharge instructions with patient. Pt reports "my neck is hurting too I want to be checked out for this." consulted PA and reports EDP already aware and spoke with patient regarding this pain. PA informed this RN to tell pt to follow up with orthopedic concerning his arm and neck pain. PA reported to continue with discharge.   Attempted to obtain up to date vitals. Pt moving extremities and moderate anxiety noted while discharge vital signs being obtained. nad noted.

## 2018-09-02 ENCOUNTER — Ambulatory Visit (INDEPENDENT_AMBULATORY_CARE_PROVIDER_SITE_OTHER): Payer: Medicare Other | Admitting: *Deleted

## 2018-09-02 DIAGNOSIS — Z5181 Encounter for therapeutic drug level monitoring: Secondary | ICD-10-CM | POA: Diagnosis not present

## 2018-09-02 DIAGNOSIS — Z7901 Long term (current) use of anticoagulants: Secondary | ICD-10-CM

## 2018-09-02 DIAGNOSIS — Z952 Presence of prosthetic heart valve: Secondary | ICD-10-CM

## 2018-09-02 LAB — POCT INR: INR: 1.8 — AB (ref 2.0–3.0)

## 2018-09-02 NOTE — Patient Instructions (Signed)
Take coumadin 4 tablets tonight and tomorrow night then resume 2 tablets daily except 3 tablets on Mondays and Thursdays  Recheck in 2 weeks

## 2018-09-10 ENCOUNTER — Other Ambulatory Visit: Payer: Self-pay | Admitting: Cardiovascular Disease

## 2018-09-16 ENCOUNTER — Ambulatory Visit (INDEPENDENT_AMBULATORY_CARE_PROVIDER_SITE_OTHER): Payer: Medicare Other | Admitting: *Deleted

## 2018-09-16 DIAGNOSIS — Z5181 Encounter for therapeutic drug level monitoring: Secondary | ICD-10-CM

## 2018-09-16 DIAGNOSIS — Z952 Presence of prosthetic heart valve: Secondary | ICD-10-CM

## 2018-09-16 DIAGNOSIS — Z7901 Long term (current) use of anticoagulants: Secondary | ICD-10-CM

## 2018-09-16 LAB — POCT INR: INR: 5.2 — AB (ref 2.0–3.0)

## 2018-09-16 NOTE — Patient Instructions (Signed)
Hold coumadin tonight then resume 2 tablets daily except 3 tablets on Mondays and Thursdays Recheck in  1 1/2 weeks

## 2018-09-25 ENCOUNTER — Ambulatory Visit (INDEPENDENT_AMBULATORY_CARE_PROVIDER_SITE_OTHER): Payer: Medicare Other | Admitting: *Deleted

## 2018-09-25 DIAGNOSIS — Z7901 Long term (current) use of anticoagulants: Secondary | ICD-10-CM | POA: Diagnosis not present

## 2018-09-25 DIAGNOSIS — Z5181 Encounter for therapeutic drug level monitoring: Secondary | ICD-10-CM

## 2018-09-25 DIAGNOSIS — Z952 Presence of prosthetic heart valve: Secondary | ICD-10-CM | POA: Diagnosis not present

## 2018-09-25 LAB — POCT INR: INR: 3.5 — AB (ref 2.0–3.0)

## 2018-09-25 NOTE — Patient Instructions (Signed)
INR 3.8 not 3.5   Take coumadin 1 tablet tonight then decrease dose to 2 tablets daily except 3 tablets on Mondays  Recheck in 2 weeks

## 2018-10-09 ENCOUNTER — Ambulatory Visit (INDEPENDENT_AMBULATORY_CARE_PROVIDER_SITE_OTHER): Payer: Medicare Other | Admitting: *Deleted

## 2018-10-09 DIAGNOSIS — Z952 Presence of prosthetic heart valve: Secondary | ICD-10-CM | POA: Diagnosis not present

## 2018-10-09 DIAGNOSIS — Z7901 Long term (current) use of anticoagulants: Secondary | ICD-10-CM | POA: Diagnosis not present

## 2018-10-09 DIAGNOSIS — Z5181 Encounter for therapeutic drug level monitoring: Secondary | ICD-10-CM | POA: Diagnosis not present

## 2018-10-09 LAB — POCT INR: INR: 3.6 — AB (ref 2.0–3.0)

## 2018-10-09 MED ORDER — WARFARIN SODIUM 5 MG PO TABS: ORAL_TABLET | ORAL | 2 refills | Status: DC

## 2018-10-09 MED ORDER — RAMIPRIL 10 MG PO CAPS: 10.0000 mg | ORAL_CAPSULE | Freq: Every day | ORAL | 3 refills | Status: DC

## 2018-10-09 MED ORDER — CARVEDILOL 25 MG PO TABS: 25.0000 mg | ORAL_TABLET | Freq: Every day | ORAL | 6 refills | Status: DC

## 2018-10-09 MED ORDER — AMLODIPINE BESYLATE 10 MG PO TABS: 10.0000 mg | ORAL_TABLET | Freq: Every day | ORAL | 3 refills | Status: DC

## 2018-10-09 NOTE — Patient Instructions (Signed)
Take coumadin 1 tablet tonight then resume 2 tablets daily except 3 tablets on Mondays  Recheck in 4 weeks

## 2018-10-18 NOTE — Congregational Nurse Program (Signed)
[facility]en at the [employer] [facility] P 168/90 P-69. Encouraged to see famiy MD. Stated he had an appointment next week. Encouraged to take medications as prescribed, eat a heart healthy diet,exercise regularly Jenene Slicker RN, [phone] [facility], .

## 2018-10-21 ENCOUNTER — Other Ambulatory Visit: Payer: Self-pay

## 2018-10-21 NOTE — Patient Outreach (Signed)
[facility]) Care Management  10/21/2018  Jarone Ahmed 1951/01/11 [id number]   Medication Adherence call to Mr. Kirt Leyvas left a message for patient to call back patient is due on Simvastatin 40 mg. Mr. Kuni is showing past due under [facility] Ins.   Lillia Abed CPhT Pharmacy Technician [facility] Management Direct Dial [phone]  Fax [phone] [email or web address]

## 2018-11-06 ENCOUNTER — Ambulatory Visit (INDEPENDENT_AMBULATORY_CARE_PROVIDER_SITE_OTHER): Payer: Medicare Other | Admitting: *Deleted

## 2018-11-06 DIAGNOSIS — Z7901 Long term (current) use of anticoagulants: Secondary | ICD-10-CM | POA: Diagnosis not present

## 2018-11-06 DIAGNOSIS — Z952 Presence of prosthetic heart valve: Secondary | ICD-10-CM

## 2018-11-06 DIAGNOSIS — Z5181 Encounter for therapeutic drug level monitoring: Secondary | ICD-10-CM | POA: Diagnosis not present

## 2018-11-06 LAB — POCT INR: INR: 2.3 (ref 2.0–3.0)

## 2018-11-06 NOTE — Patient Instructions (Signed)
Take coumadin 3 tablets tonight then resume 2 tablets daily except 3 tablets on Mondays  Recheck in 4 weeks

## 2018-11-19 ENCOUNTER — Encounter: Payer: Self-pay | Admitting: *Deleted

## 2018-12-11 ENCOUNTER — Ambulatory Visit (INDEPENDENT_AMBULATORY_CARE_PROVIDER_SITE_OTHER): Payer: Medicare Other | Admitting: Pharmacist

## 2018-12-11 DIAGNOSIS — Z5181 Encounter for therapeutic drug level monitoring: Secondary | ICD-10-CM

## 2018-12-11 DIAGNOSIS — Z952 Presence of prosthetic heart valve: Secondary | ICD-10-CM | POA: Diagnosis not present

## 2018-12-11 DIAGNOSIS — Z7901 Long term (current) use of anticoagulants: Secondary | ICD-10-CM | POA: Diagnosis not present

## 2018-12-11 LAB — POCT INR: INR: 2.4 (ref 2.0–3.0)

## 2018-12-11 NOTE — Patient Instructions (Signed)
Description   Take coumadin 3 tablets tonight then resume 2 tablets daily except 3 tablets on Mondays  Recheck in 3 weeks

## 2019-01-01 ENCOUNTER — Ambulatory Visit (INDEPENDENT_AMBULATORY_CARE_PROVIDER_SITE_OTHER): Payer: Medicare Other | Admitting: Pharmacist

## 2019-01-01 DIAGNOSIS — Z7901 Long term (current) use of anticoagulants: Secondary | ICD-10-CM | POA: Diagnosis not present

## 2019-01-01 DIAGNOSIS — Z5181 Encounter for therapeutic drug level monitoring: Secondary | ICD-10-CM

## 2019-01-01 DIAGNOSIS — Z952 Presence of prosthetic heart valve: Secondary | ICD-10-CM | POA: Diagnosis not present

## 2019-01-01 LAB — POCT INR: INR: 3.9 — AB (ref 2.0–3.0)

## 2019-01-01 NOTE — Patient Instructions (Signed)
Description   Take 1 tablet tonight then resume 2 tablets daily except 3 tablets on Mondays  Recheck in 4 weeks

## 2019-02-06 ENCOUNTER — Other Ambulatory Visit: Payer: Self-pay

## 2019-02-06 NOTE — Patient Outreach (Signed)
[facility]) Care Management  02/06/2019  [name] [birth year] [id number]   Medication Adherence call to [name] spoke with patient he is having problems with his phone and wants a call back patient is due on Simvastatin 40 mg under [employer] Ins.   Lillia Abed CPhT Pharmacy Technician [facility] Care Management Direct Dial [phone]  Fax [phone] [email or web address]

## 2019-02-11 ENCOUNTER — Telehealth: Payer: Self-pay | Admitting: Pharmacist

## 2019-02-11 MED ORDER — WARFARIN SODIUM 5 MG PO TABS: ORAL_TABLET | ORAL | 0 refills | Status: DC

## 2019-02-11 NOTE — Telephone Encounter (Signed)
Called patient to screen for COVID 19. Patient states he has been out of coumadin for 5 days. It looks like patient missed his last appointment. I have sent in enough for 2 weeks and have rescheduled his appointment.

## 2019-02-12 NOTE — Congregational Nurse Program (Signed)
  Dept: [phone]   Congregational Nurse Program Note  Date of Encounter: 02/12/2019  Past Medical History: Past Medical History:  Diagnosis Date  . Coronary artery disease   . Hypertension   . Poor historian   . 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 @ [facility]   Referrals  Not Applicable    ED Visit Averted  Not Applicable    Life-Saving Intervention Made  Not Applicable     B/P 148/88 Pulse 53 Takes Coumadin. Had a problem with Pharmacy filling refill of medication.  Had to get the Cardiac Dept. To get this done. Advised to keep appointments with the clinic. [facility] Setting [phone].

## 2019-02-24 ENCOUNTER — Telehealth: Payer: Self-pay | Admitting: Cardiovascular Disease

## 2019-02-24 NOTE — Telephone Encounter (Signed)
° ° ° °  COVID-19 Pre-Screening Questions: ° °• Do you currently have a fever? No °•  °• Have you recently travelled on a cruise, internationally, or to [place], [place], [place], [place], [place], or [place], [place] ([employer]) ? No °•  °• Have you been in contact with someone that is currently pending confirmation of Covid19 testing or has been confirmed to have the Covid19 virus? No °•  °• Are you currently experiencing fatigue or cough? No  ° ° °   ° ° ° ° ° °

## 2019-02-25 ENCOUNTER — Other Ambulatory Visit: Payer: Self-pay

## 2019-02-25 ENCOUNTER — Ambulatory Visit (INDEPENDENT_AMBULATORY_CARE_PROVIDER_SITE_OTHER): Payer: Medicare Other | Admitting: *Deleted

## 2019-02-25 DIAGNOSIS — Z5181 Encounter for therapeutic drug level monitoring: Secondary | ICD-10-CM | POA: Diagnosis not present

## 2019-02-25 DIAGNOSIS — Z7901 Long term (current) use of anticoagulants: Secondary | ICD-10-CM

## 2019-02-25 DIAGNOSIS — Z9889 Other specified postprocedural states: Secondary | ICD-10-CM | POA: Diagnosis not present

## 2019-02-25 DIAGNOSIS — Z952 Presence of prosthetic heart valve: Secondary | ICD-10-CM

## 2019-02-25 LAB — POCT INR: INR: 4.1 — AB (ref 2.0–3.0)

## 2019-02-25 MED ORDER — WARFARIN SODIUM 5 MG PO TABS: ORAL_TABLET | ORAL | 2 refills | Status: DC

## 2019-02-25 NOTE — Patient Instructions (Signed)
Hold coumadin tonight then decrease dose to 2 tablets daily except 1 tablet on Mondays  Recheck in 4 weeks

## 2019-03-11 ENCOUNTER — Other Ambulatory Visit: Payer: Self-pay | Admitting: Cardiovascular Disease

## 2019-03-11 ENCOUNTER — Other Ambulatory Visit: Payer: Self-pay

## 2019-03-11 NOTE — Patient Outreach (Signed)
[facility]) Care Management  03/11/2019  [name] Nov 26, 1950 [id number]   Medication Adherence call to [name] patient did not answer patient is due on Simvastatin 40 mg under [facility] Ins.   Lillia Abed CPhT Pharmacy Technician [facility] Care Management Direct Dial [phone]  Fax [phone] [email or web address]

## 2019-04-09 ENCOUNTER — Other Ambulatory Visit: Payer: Self-pay | Admitting: Cardiovascular Disease

## 2019-04-23 ENCOUNTER — Telehealth: Payer: Self-pay | Admitting: *Deleted

## 2019-04-23 NOTE — Telephone Encounter (Signed)

## 2019-04-24 ENCOUNTER — Ambulatory Visit (INDEPENDENT_AMBULATORY_CARE_PROVIDER_SITE_OTHER): Payer: Medicare Other | Admitting: *Deleted

## 2019-04-24 DIAGNOSIS — Z7901 Long term (current) use of anticoagulants: Secondary | ICD-10-CM | POA: Diagnosis not present

## 2019-04-24 DIAGNOSIS — Z952 Presence of prosthetic heart valve: Secondary | ICD-10-CM

## 2019-04-24 DIAGNOSIS — Z5181 Encounter for therapeutic drug level monitoring: Secondary | ICD-10-CM

## 2019-04-24 LAB — POCT INR: INR: 2.7 (ref 2.0–3.0)

## 2019-04-24 NOTE — Patient Instructions (Signed)
Continue coumadin 2 tablets daily except 1 tablet on Mondays  Recheck in 3 weeks

## 2019-05-13 ENCOUNTER — Emergency Department (HOSPITAL_COMMUNITY): Payer: Medicare Other

## 2019-05-13 ENCOUNTER — Emergency Department (HOSPITAL_COMMUNITY)
Admission: EM | Admit: 2019-05-13 | Discharge: 2019-05-13 | Disposition: A | Discharge status: Home / Self Care | Payer: Medicare Other | Attending: Emergency Medicine | Admitting: Emergency Medicine

## 2019-05-13 ENCOUNTER — Other Ambulatory Visit: Payer: Self-pay

## 2019-05-13 ENCOUNTER — Encounter (HOSPITAL_COMMUNITY): Payer: Self-pay | Admitting: Emergency Medicine

## 2019-05-13 DIAGNOSIS — I2510 Atherosclerotic heart disease of native coronary artery without angina pectoris: Secondary | ICD-10-CM | POA: Insufficient documentation

## 2019-05-13 DIAGNOSIS — M7981 Nontraumatic hematoma of soft tissue: Secondary | ICD-10-CM | POA: Insufficient documentation

## 2019-05-13 DIAGNOSIS — I10 Essential (primary) hypertension: Secondary | ICD-10-CM | POA: Insufficient documentation

## 2019-05-13 DIAGNOSIS — T148XXA Other injury of unspecified body region, initial encounter: Secondary | ICD-10-CM

## 2019-05-13 DIAGNOSIS — R791 Abnormal coagulation profile: Secondary | ICD-10-CM | POA: Diagnosis not present

## 2019-05-13 DIAGNOSIS — M79622 Pain in left upper arm: Secondary | ICD-10-CM | POA: Diagnosis not present

## 2019-05-13 DIAGNOSIS — M79602 Pain in left arm: Secondary | ICD-10-CM | POA: Diagnosis present

## 2019-05-13 DIAGNOSIS — Z952 Presence of prosthetic heart valve: Secondary | ICD-10-CM | POA: Insufficient documentation

## 2019-05-13 DIAGNOSIS — Z79899 Other long term (current) drug therapy: Secondary | ICD-10-CM | POA: Insufficient documentation

## 2019-05-13 DIAGNOSIS — Z7901 Long term (current) use of anticoagulants: Secondary | ICD-10-CM | POA: Insufficient documentation

## 2019-05-13 LAB — CBC WITH DIFFERENTIAL/PLATELET
Abs Immature Granulocytes: 0.02 10*3/uL (ref 0.00–0.07)
Basophils Absolute: 0 10*3/uL (ref 0.0–0.1)
Basophils Relative: 0 %
Eosinophils Absolute: 0.1 10*3/uL (ref 0.0–0.5)
Eosinophils Relative: 1 %
HCT: 40.1 % (ref 39.0–52.0)
Hemoglobin: 12.6 g/dL — ABNORMAL LOW (ref 13.0–17.0)
Immature Granulocytes: 0 %
Lymphocytes Relative: 12 %
Lymphs Abs: 0.9 10*3/uL (ref 0.7–4.0)
MCH: 25.4 pg — ABNORMAL LOW (ref 26.0–34.0)
MCHC: 31.4 g/dL (ref 30.0–36.0)
MCV: 80.8 fL (ref 80.0–100.0)
Monocytes Absolute: 0.8 10*3/uL (ref 0.1–1.0)
Monocytes Relative: 10 %
Neutro Abs: 6.2 10*3/uL (ref 1.7–7.7)
Neutrophils Relative %: 77 %
Platelets: 187 10*3/uL (ref 150–400)
RBC: 4.96 MIL/uL (ref 4.22–5.81)
RDW: 15.4 % (ref 11.5–15.5)
WBC: 8.1 10*3/uL (ref 4.0–10.5)
nRBC: 0 % (ref 0.0–0.2)

## 2019-05-13 LAB — BASIC METABOLIC PANEL
Anion gap: 9 (ref 5–15)
BUN: 16 mg/dL (ref 8–23)
CO2: 24 mmol/L (ref 22–32)
Calcium: 9 mg/dL (ref 8.9–10.3)
Chloride: 106 mmol/L (ref 98–111)
Creatinine, Ser: 0.99 mg/dL (ref 0.61–1.24)
GFR calc Af Amer: >60 mL/min (ref >60)
GFR calc non Af Amer: >60 mL/min (ref >60)
Glucose, Bld: 111 mg/dL — ABNORMAL HIGH (ref 70–99)
Potassium: 4 mmol/L (ref 3.5–5.1)
Sodium: 139 mmol/L (ref 135–145)

## 2019-05-13 LAB — PROTIME-INR
INR: 3.7 — ABNORMAL HIGH (ref 0.8–1.2)
Prothrombin Time: 36.4 seconds — ABNORMAL HIGH (ref 11.4–15.2)

## 2019-05-13 MED ORDER — HYDROCODONE-ACETAMINOPHEN 5-325 MG PO TABS
1.0000 | ORAL_TABLET | Freq: Once | ORAL | Status: DC
  Filled 2019-05-13: qty 1

## 2019-05-13 MED ORDER — HYDROCODONE-ACETAMINOPHEN 5-325 MG PO TABS
1.0000 | ORAL_TABLET | Freq: Once | ORAL | Status: AC
  Administered 2019-05-13: 1 via ORAL
  Filled 2019-05-13: qty 1

## 2019-05-13 MED ORDER — HYDROCODONE-ACETAMINOPHEN 5-325 MG PO TABS: 1.0000 | ORAL_TABLET | Freq: Four times a day (QID) | ORAL | 0 refills | Status: DC | PRN

## 2019-05-13 NOTE — ED Notes (Signed)
Pt returned from CT °

## 2019-05-13 NOTE — ED Notes (Signed)
Patient transported to CT 

## 2019-05-13 NOTE — Discharge Instructions (Addendum)
Your Coumadin for the next 1 day.  Follow-up with cardiology for repeat lab work.  Follow-up with PCP for hematoma.  May place heat to the area.  Return to the ED for any new or worsening symptoms.

## 2019-05-13 NOTE — ED Provider Notes (Signed)
Medical screening examination/treatment/procedure(s) were conducted as a shared visit with non-physician practitioner(s) and myself.  I personally evaluated the patient during the encounter.      Patient seen by me along with the physician assistant.  Patient's clinical presentation very suggestive of a rupture of his biceps muscle.  Patient has a lot of bruising in that area.  Patient thought maybe an insect had bit him but he never saw the insect.  There is no signs of bite.  The bruising is suggestive of something deeper into the skin.  There is no significant distal forearm swelling.  But DVT not completely ruled out.  Radial pulses 2+.  Patient has pain with range of motion at the elbow.  We will go ahead and get regular x-rays of that is negative we will get CT.  If that does not give [place] an answer we will do Doppler studies.   Fredia Sorrow, MD 05/13/19 1139

## 2019-05-13 NOTE — ED Provider Notes (Signed)
[facility] Provider Note   CSN: [id number] Arrival date & time: 05/13/19  0941  History   Chief Complaint Chief Complaint  Patient presents with   Arm Pain   HPI [name] is a 68 y.o. male with past medical history significant for CAD, hypertension, aortic valve replacement on Coumadin who presents for evaluation of left arm pain and swelling.  Patient states he was doing yard work out in the yard and thought a bug bit him due to a sharp pain to his left mid humerus.  Patient states he slept the area and did not "think anything of it."  Patient states he did not see a bug bite him.  Patient states he had generalized achiness to this area since yesterday evening.  has been taking Tylenol without relief of his pain.  Patient states he woke up this morning with significant swelling and ecchymosis to his left mid biceps.  States he has had decreased range of motion secondary to pain since the incident.  He is on Coumadin and has not not missed any doses.  Denies fever, chills, nausea, vomiting, numbness or tingling in his extremities, redness, warmth in his extremities, chest pain, shortness of breath.  He rates his current pain a 8/10.  Denies radiation of his pain into his forearm, left shoulder or chest.  Denies additional aggravating or alleviating factors.  Patient states he recently had his INR checked last week and was therapeutic.  History obtained from patient and past medical records.  No interpreter was used.     HPI  Past Medical History:  Diagnosis Date   Coronary artery disease    Hypertension    Poor historian    Sexual dysfunction     Patient Active Problem List   Diagnosis Date Noted   Depression 07/16/2016   Acute chest pain 07/15/2016   Chest pain 07/15/2016   MDD (major depressive disorder), single episode 07/15/2016   Arm pain, right 10/23/2014   Encounter for therapeutic drug monitoring 01/28/2014   H/O aortic root repair  10/22/2013   HTN (hypertension) 10/22/2013   Hyperlipidemia 10/22/2013   Long term (current) use of anticoagulants 03/07/2013   Aortic valve replaced 03/07/2013    Past Surgical History:  Procedure Laterality Date   CIRCUMCISION  2006   --which was uncomplicated   CORONARY ANGIOPLASTY WITH STENT PLACEMENT     EXPLORATION POST OPERATIVE OPEN HEART     open heart surgery        Home Medications    Prior to Admission medications   Medication Sig Start Date End Date Taking? Authorizing Provider  amLODipine (NORVASC) 10 MG tablet TAKE ONE TABLET BY MOUTH ONCE DAILY. 03/11/19  Yes Laqueta LindenKoneswaran, Suresh A, MD  carvedilol (COREG) 25 MG tablet Take 1 tablet (25 mg total) by mouth daily. 10/09/18  Yes Laqueta LindenKoneswaran, Suresh A, MD  ramipril (ALTACE) 10 MG capsule Take 1 capsule (10 mg total) by mouth daily. 10/09/18  Yes Laqueta LindenKoneswaran, Suresh A, MD  simvastatin (ZOCOR) 40 MG tablet Take 1 tablet (40 mg total) by mouth at bedtime. 07/01/18  Yes Laqueta LindenKoneswaran, Suresh A, MD  tamsulosin (FLOMAX) 0.4 MG CAPS capsule TAKE ONE CAPSULE BY MOUTH AT BEDTIME. 04/09/19  Yes Laqueta LindenKoneswaran, Suresh A, MD  triamterene-hydrochlorothiazide (MAXZIDE-25) 37.5-25 MG tablet TAKE ONE TABLET BY MOUTH THREE TIMES WEEKLY. 05/17/18  Yes Laqueta LindenKoneswaran, Suresh A, MD  warfarin (COUMADIN) 5 MG tablet Take 2 tablets daily except 1 tablets on Mondays 02/25/19  Yes Laqueta LindenKoneswaran, Suresh A,  MD  folic acid (FOLVITE) 1 MG tablet TAKE ONE TABLET BY MOUTH DAILY. Patient not taking: Reported on 05/13/2019 01/31/16   Herminio Commons, MD  HYDROcodone-acetaminophen (NORCO/VICODIN) 5-325 MG tablet Take 1 tablet by mouth every 6 (six) hours as needed for severe pain. 05/13/19   Jerald Hennington A, PA-C    Family History Family History  Problem Relation Age of Onset   Hypertension Mother    Diabetes Mother    Alcoholism Father     Social History Social History   Tobacco Use   Smoking status: Never Smoker   Smokeless tobacco:  Never Used  Substance Use Topics   Alcohol use: No   Drug use: Yes    Types: Marijuana    Comment: last use 1 month ago     Allergies   Iohexol, Aspirin, and Contrast media [iodinated diagnostic agents]   Review of Systems Review of Systems  Constitutional: Negative.   HENT: Negative.   Respiratory: Negative.   Cardiovascular: Negative.   Gastrointestinal: Negative.   Musculoskeletal:       Left arm pain  Skin:       bruising  Neurological: Negative.   All other systems reviewed and are negative.   Physical Exam Updated Vital Signs BP (!) 131/92    Pulse (!) 54    Temp 98.1 F (36.7 C) (Oral)    Resp 12    Ht 6\' 1"  (1.854 m)    Wt 117.9 kg    SpO2 100%    BMI 34.30 kg/m   Physical Exam Vitals signs and nursing note reviewed.  Constitutional:      General: He is not in acute distress.    Appearance: He is well-developed. He is not ill-appearing, toxic-appearing or diaphoretic.  HENT:     Head: Atraumatic.     Nose: Nose normal.     Mouth/Throat:     Mouth: Mucous membranes are moist.     Pharynx: Oropharynx is clear.  Eyes:     Pupils: Pupils are equal, round, and reactive to light.  Neck:     Musculoskeletal: Normal range of motion and neck supple.  Cardiovascular:     Rate and Rhythm: Normal rate and regular rhythm.     Pulses: Normal pulses.          Radial pulses are 2+ on the right side and 2+ on the left side.     Heart sounds: Normal heart sounds. No murmur. No friction rub. No gallop.   Pulmonary:     Effort: Pulmonary effort is normal. No respiratory distress.     Breath sounds: Normal breath sounds. No stridor. No wheezing, rhonchi or rales.  Chest:     Chest wall: No tenderness.  Abdominal:     General: Bowel sounds are normal. There is no distension.     Palpations: Abdomen is soft. There is no mass.     Tenderness: There is no abdominal tenderness. There is no right CVA tenderness, left CVA tenderness, guarding or rebound.     Hernia: No  hernia is present.  Musculoskeletal: Normal range of motion.     Comments: Swelling to anterior surface of left biceps..  Mildly tender to palpation.  No deformity or edema.  Full range of motion without difficulty bilateral upper extremities.  Skin:    General: Skin is warm and dry.     Comments: Ecchymosis to left biceps area.  No rashes, erythema, warmth.  Brisk capillary refill.  Neurological:  Mental Status: He is alert.     Comments: 5/5 strength bilateral upper extremities without difficulty.  No weakness.  No sensory deficit.       ED Treatments / Results  Labs (all labs ordered are listed, but only abnormal results are displayed) Labs Reviewed  CBC WITH DIFFERENTIAL/PLATELET - Abnormal; Notable for the following components:      Result Value   Hemoglobin 12.6 (*)    MCH 25.4 (*)    All other components within normal limits  BASIC METABOLIC PANEL - Abnormal; Notable for the following components:   Glucose, Bld 111 (*)    All other components within normal limits  PROTIME-INR - Abnormal; Notable for the following components:   Prothrombin Time 36.4 (*)    INR 3.7 (*)    All other components within normal limits    EKG None  Radiology Ct Humerus Left Wo Contrast  Result Date: 05/13/2019 CLINICAL DATA:  Woke up with left arm pain and bruising. No injury. Concern for biceps rupture. Unable to obtain MRI due to valve replacement. EXAM: CT OF THE UPPER LEFT EXTREMITY WITHOUT CONTRAST TECHNIQUE: Multidetector CT imaging of the upper left extremity was performed according to the standard protocol. COMPARISON:  Left upper extremity DVT ultrasound from same day. FINDINGS: Bones/Joint/Cartilage No acute fracture or dislocation. Mild degenerative changes of the elbow, glenohumeral joint, and acromioclavicular joint. Subacromial spurring. No joint effusion. Ligaments Suboptimally assessed by CT. Muscles and Tendons Heterogeneous intermediate and high density collection in the mid  biceps muscle, most consistent with hematoma. This measures approximately 5.1 x 5.1 x 8.7 cm. The distal biceps tendon is grossly intact. The proximal biceps tendon is not well evaluated by CT. Soft tissues Soft tissue swelling along the anterior mid to distal upper arm. 1.1 x 3.0 x 2.3 cm lipoma posterior to the medial scapular spine. IMPRESSION: 1. 5.1 x 5.1 x 8.7 cm mid biceps intramuscular hematoma. In the absence of trauma or injury, this is likely related to patient's anticoagulation. Electronically Signed   By: Obie Dredge M.D.   On: 05/13/2019 14:55   US Venous Img Upper Uni Left  Result Date: 05/13/2019 CLINICAL DATA:  Left arm pain after bug bite. EXAM: LEFT UPPER EXTREMITY VENOUS DOPPLER ULTRASOUND TECHNIQUE: Gray-scale sonography with graded compression, as well as color Doppler and duplex ultrasound were performed to evaluate the upper extremity deep venous system from the level of the subclavian vein and including the jugular, axillary, basilic, radial, ulnar and upper cephalic vein. Spectral Doppler was utilized to evaluate flow at rest and with distal augmentation maneuvers. COMPARISON:  None. FINDINGS: Contralateral Subclavian Vein: Respiratory phasicity is normal and symmetric with the symptomatic side. No evidence of thrombus. Normal compressibility. Internal Jugular Vein: No evidence of thrombus. Normal compressibility, respiratory phasicity and response to augmentation. Subclavian Vein: No evidence of thrombus. Normal compressibility, respiratory phasicity and response to augmentation. Axillary Vein: No evidence of thrombus. Normal compressibility, respiratory phasicity and response to augmentation. Cephalic Vein: No evidence of thrombus. Normal compressibility, respiratory phasicity and response to augmentation. Basilic Vein: No evidence of thrombus. Normal compressibility, respiratory phasicity and response to augmentation. Brachial Veins: No evidence of thrombus. Normal  compressibility, respiratory phasicity and response to augmentation. Radial Veins: No evidence of thrombus. Normal compressibility and color Doppler flow. Ulnar Veins: No evidence of thrombus. Normal compressibility and color Doppler flow. Venous Reflux:  None visualized. Other Findings: Mildly hypoechoic rounded lesion in the left upper arm. This structure measures 8.0 x 4.9 x 5.0  cm. No significant vascular flow within this structure. There is small cystic component to this structure. IMPRESSION: No evidence of DVT within the left upper extremity. Large rounded collection in the left upper arm without internal vascularity. This structure measures up to 8.0 cm. Structure appears to be relatively solid but there is no internal vascularity and could represent a complex fluid collection such as hematoma or atypical infectious or inflammatory collection. Electronically Signed   By: Richarda OverlieAdam  Henn M.D.   On: 05/13/2019 12:55   Dg Humerus Left  Result Date: 05/13/2019 CLINICAL DATA:  Left upper arm pain after possible injury yesterday. EXAM: LEFT HUMERUS - 2+ VIEW COMPARISON:  None. FINDINGS: There is no evidence of fracture or other focal bone lesions. Soft tissues are unremarkable. IMPRESSION: Negative. Electronically Signed   By: Lupita RaiderJames  Green Jr M.D.   On: 05/13/2019 11:32    Procedures Procedures (including critical care time)  Medications Ordered in ED Medications  HYDROcodone-acetaminophen (NORCO/VICODIN) 5-325 MG per tablet 1 tablet (1 tablet Oral Not Given 05/13/19 1131)  HYDROcodone-acetaminophen (NORCO/VICODIN) 5-325 MG per tablet 1 tablet (1 tablet Oral Given 05/13/19 1138)   Initial Impression / Assessment and Plan / ED Course  I have reviewed the triage vital signs and the nursing notes.  Pertinent labs & imaging results that were available during my care of the patient were reviewed by me and considered in my medical decision making (see chart for details).  68 year old male appears otherwise  well presents for evaluation of left arm swelling and bruising.  Afebrile, nonseptic, non-ill-appearing.  Symptoms began as he was doing yard work yesterday.  He denies any injury or trauma.  He is on Coumadin for valve replacement.  Denies any chest pain or shortness of breath.  No pain that radiates into his left arm.  Tenderness, bruising to left biceps.  Her musculoskeletal exam.  Neurovascularly intact.  Denies any fevers or chills.  No systemic symptoms.  Low suspicion for infectious process, atypical ACS, PE, dissection.  Possible hematoma versus biceps rupture.  Patient cannot undergo MRI due to valve replacement.  He is allergic to contrast.  Ultrasound negative for DVT however with possible hematoma.  CT scan with evidence of intramuscular hematoma.  Possibly due to his anticoagulation.  His INR is supratherapeutic at 3.7.  Discussed with patient to hold it for 1 day and have this reevaluated by cardiology whom he follows for this.  Unable to determine biceps rupture due to imaging study.  Area has not grown in size since initial evaluation.  Patient has been ED for 5 hours.  Pain well controlled.  I have low suspicion for vascular injury at this time.  X-ray negative for fracture or dislocation.  Labs and imaging personally reviewed.  Labs at baseline except for INR.  Tolerating p.o. and ambulating in ED without difficulty.  I have low suspicion for acute emergent pathology which would require inpatient management.  Will have patient follow-up with cardiology for recheck of his INR.  Follow-up with PCP for hematoma reevaluation in 2 days.  The patient has been appropriately medically screened and/or stabilized in the ED. I have low suspicion for any other emergent medical condition which would require further screening, evaluation or treatment in the ED or require inpatient management.  Patient is hemodynamically stable and in no acute distress.  Patient able to ambulate in department prior to ED.   Evaluation does not show acute pathology that would require ongoing or additional emergent interventions while in the  emergency department or further inpatient treatment.  I have discussed the diagnosis with the patient and answered all questions.  Pain is been managed while in the emergency department and patient has no further complaints prior to discharge.  Patient is comfortable with plan discussed in room and is stable for discharge at this time.  I have discussed strict return precautions for returning to the emergency department.  Patient was encouraged to follow-up with PCP/specialist refer to at discharge.   Patient has been seen evaluated by attending, Dr. Deretha EmoryZackowski who agrees with above treatment, plan and disposition.      Final Clinical Impressions(s) / ED Diagnoses   Final diagnoses:  Intramuscular hematoma  Supratherapeutic INR    ED Discharge Orders         Ordered    HYDROcodone-acetaminophen (NORCO/VICODIN) 5-325 MG tablet  Every 6 hours PRN     05/13/19 1507           Kennya Schwenn A, PA-C 05/13/19 1634    Vanetta MuldersZackowski, Scott, MD 05/18/19 1645

## 2019-05-13 NOTE — ED Triage Notes (Signed)
Pt reports feeling like he was bit by an insect around 2000 last night and woke up with left arm bruising and swelling today

## 2019-06-23 ENCOUNTER — Other Ambulatory Visit: Payer: Self-pay | Admitting: Cardiovascular Disease

## 2019-06-23 ENCOUNTER — Other Ambulatory Visit: Payer: Self-pay | Admitting: *Deleted

## 2019-06-23 MED ORDER — TRIAMTERENE-HCTZ 37.5-25 MG PO TABS: ORAL_TABLET | ORAL | 3 refills | Status: DC

## 2019-06-23 MED ORDER — AMLODIPINE BESYLATE 10 MG PO TABS: 10.0000 mg | ORAL_TABLET | Freq: Every day | ORAL | 3 refills | Status: DC

## 2019-07-03 ENCOUNTER — Ambulatory Visit (INDEPENDENT_AMBULATORY_CARE_PROVIDER_SITE_OTHER): Payer: Medicare Other | Admitting: *Deleted

## 2019-07-03 ENCOUNTER — Other Ambulatory Visit: Payer: Self-pay

## 2019-07-03 DIAGNOSIS — Z5181 Encounter for therapeutic drug level monitoring: Secondary | ICD-10-CM | POA: Diagnosis not present

## 2019-07-03 DIAGNOSIS — Z952 Presence of prosthetic heart valve: Secondary | ICD-10-CM | POA: Diagnosis not present

## 2019-07-03 DIAGNOSIS — Z7901 Long term (current) use of anticoagulants: Secondary | ICD-10-CM

## 2019-07-03 LAB — POCT INR: INR: 3.7 — AB (ref 2.0–3.0)

## 2019-07-03 NOTE — Patient Instructions (Signed)
Take coumadin 1 tablet tonight then resume 2 tablets daily except 1 tablet on Mondays  Recheck in 4 weeks

## 2019-07-08 ENCOUNTER — Other Ambulatory Visit: Payer: Self-pay

## 2019-07-08 NOTE — Patient Outreach (Signed)
[facility]) Care Management  07/08/2019  Takai Chiaramonte 1951/11/19 [id number]   Medication Adherence call to Mr. Sevan Mcbroom HIPPA Compliant Voice message left with a call back number. Mr. Lopata is showing past due on Simvastatin 40 mg under [place].   [place] [facility] Management Direct Dial [phone]  Fax [phone] [email or web address]

## 2019-07-10 ENCOUNTER — Other Ambulatory Visit: Payer: Self-pay | Admitting: Cardiovascular Disease

## 2019-07-10 NOTE — Telephone Encounter (Signed)
Gave enough medication to make it to the appt.

## 2019-07-24 ENCOUNTER — Other Ambulatory Visit: Payer: Self-pay | Admitting: Cardiovascular Disease

## 2019-08-06 ENCOUNTER — Other Ambulatory Visit: Payer: Self-pay | Admitting: Cardiovascular Disease

## 2019-08-21 ENCOUNTER — Telehealth: Payer: Self-pay | Admitting: Cardiovascular Disease

## 2019-08-21 NOTE — Telephone Encounter (Signed)

## 2019-08-22 ENCOUNTER — Telehealth: Payer: Self-pay | Admitting: Licensed Clinical Social Worker

## 2019-08-22 ENCOUNTER — Telehealth (INDEPENDENT_AMBULATORY_CARE_PROVIDER_SITE_OTHER): Payer: Medicare Other | Admitting: Cardiovascular Disease

## 2019-08-22 ENCOUNTER — Encounter: Payer: Self-pay | Admitting: Cardiovascular Disease

## 2019-08-22 DIAGNOSIS — I429 Cardiomyopathy, unspecified: Secondary | ICD-10-CM

## 2019-08-22 DIAGNOSIS — E785 Hyperlipidemia, unspecified: Secondary | ICD-10-CM

## 2019-08-22 DIAGNOSIS — Z7901 Long term (current) use of anticoagulants: Secondary | ICD-10-CM

## 2019-08-22 DIAGNOSIS — Z952 Presence of prosthetic heart valve: Secondary | ICD-10-CM

## 2019-08-22 DIAGNOSIS — I10 Essential (primary) hypertension: Secondary | ICD-10-CM

## 2019-08-22 NOTE — Progress Notes (Signed)
Virtual Visit via Telephone Note   This visit type was conducted due to national recommendations for restrictions regarding the COVID-19 Pandemic (e.g. social distancing) in an effort to limit this patient's exposure and mitigate transmission in our community.  Due to his co-morbid illnesses, this patient is at least at moderate risk for complications without adequate follow up.  This format is felt to be most appropriate for this patient at this time.  The patient did not have access to video technology/had technical difficulties with video requiring transitioning to audio format only (telephone).  All issues noted in this document were discussed and addressed.  No physical exam could be performed with this format.  Please refer to the patient's chart for his  consent to telehealth for [facility].   Date:  08/22/2019   ID:  [name], DOB 1951-03-23, MRN [id number]  Patient Location: Home Provider Location: Home  PCP:  Patient, No Pcp Per  Cardiologist:  Prentice Docker, MD  Electrophysiologist:  None   Evaluation Performed:  Follow-Up Visit  Chief Complaint:  S/p AVR  History of Present Illness:    [name] is a 68 y.o. male with a past medical history significant for aortic valve replacement (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   He also has hypertension and hyperlipidemia.   His cardiac catheterization prior to his surgery reportedly revealed normal coronary arteries with a dominant left circumflex coronary artery.   He still does not have a PCP.  The patient denies any symptoms of chest pain, palpitations, shortness of breath, lightheadedness, dizziness, leg swelling, orthopnea, PND, and syncope.  The patient does not have symptoms concerning for COVID-19 infection (fever, chills, cough, or new shortness of breath).    Past Medical  History:  Diagnosis Date  . Coronary artery disease   . Hypertension   . Poor historian   . Sexual dysfunction    Past Surgical History:  Procedure Laterality Date  . CIRCUMCISION  2006   [place]--which was uncomplicated  . CORONARY ANGIOPLASTY WITH STENT PLACEMENT    . EXPLORATION POST OPERATIVE OPEN HEART     open heart surgery     Current Meds  Medication Sig  . amLODipine (NORVASC) 10 MG tablet Take 1 tablet (10 mg total) by mouth daily.  . carvedilol (COREG) 25 MG tablet TAKE 1 TABLET BY MOUTH ONCE A DAY.  Marland Kitchen HYDROcodone-acetaminophen (NORCO/VICODIN) 5-325 MG tablet Take 1 tablet by mouth every 6 (six) hours as needed for severe pain.  . ramipril (ALTACE) 10 MG capsule Take 1 capsule (10 mg total) by mouth daily.  . simvastatin (ZOCOR) 40 MG tablet TAKE ONE TABLET BY MOUTH DAILY AT BEDTIME.  . tamsulosin (FLOMAX) 0.4 MG CAPS capsule TAKE ONE CAPSULE BY MOUTH AT BEDTIME.  Marland Kitchen triamterene-hydrochlorothiazide (MAXZIDE-25) 37.5-25 MG tablet TAKE ONE TABLET BY MOUTH THREE TIMES WEEKLY.  . warfarin (COUMADIN) 5 MG tablet TAKE 2 TABLETS BY MOUTH DAILY EXCEPT 1 TABLET ON MONDAYS.     Allergies:   Iohexol, Aspirin, and Contrast media [iodinated diagnostic agents]   Social History   Tobacco Use  . Smoking status: Never Smoker  . Smokeless tobacco: Never Used  Substance Use Topics  . Alcohol use: No  . Drug use: Yes    Types: Marijuana    Comment: last use 1 month ago     Family Hx: The patient's family history includes  Alcoholism in his father; Diabetes in his mother; Hypertension in his mother.  ROS:   Please see the history of present illness.     All other systems reviewed and are negative.   Prior CV studies:   The following studies were reviewed today:  Most recent echocardiogram performed on 07/16/2016 demonstrated mildly reduced left ventricular systolic function, LVEF 45 to 50%, moderate LVH, normal regional wall motion, normally functioning tilting-disc aortic  valve prosthesis, moderate biatrial dilatation, and mildly dilated right ventricle.   Labs/Other Tests and Data Reviewed:    EKG:  No ECG reviewed.  Recent Labs: 05/13/2019: BUN 16; Creatinine, Ser 0.99; Hemoglobin 12.6; Platelets 187; Potassium 4.0; Sodium 139   Recent Lipid Panel Lab Results  Component Value Date/Time   CHOL 180 04/29/2018 10:32 AM   TRIG 67 04/29/2018 10:32 AM   HDL 72 04/29/2018 10:32 AM   CHOLHDL 2.5 04/29/2018 10:32 AM   LDLCALC 95 04/29/2018 10:32 AM    Wt Readings from Last 3 Encounters:  05/13/19 260 lb (117.9 kg)  08/10/18 240 lb (108.9 kg)  04/29/18 230 lb (104.3 kg)     Objective:    Vital Signs:  Ht 6\' 1"  (1.854 m)   BMI 34.30 kg/m    VITAL SIGNS:  reviewed  ASSESSMENT & PLAN:    1. S/p AVR and aortic root repair: Symptomatically stable.  Normally functioning prosthetic aortic valve by echocardiogram in August 2017. Continue warfarin foranticoagulation.   2. Essential HTN: No changes to therapy.  3. Hyperlipidemia:Continue simvastatin 40 mg. Lipids were normal in June 2019.   4.  Chronic anticoagulation: Denies bleeding problems. Hgb 12.6 on 05/13/19.  5.  Cardiomyopathy: LVEF is mildly reduced as noted above. Continue carvedilol and ramipril.  Nothing to suggest hypervolemia.    COVID-19 Education: The signs and symptoms of COVID-19 were discussed with the patient and how to seek care for testing (follow up with PCP or arrange E-visit).  The importance of social distancing was discussed today.  Time:   Today, I have spent 5 minutes with the patient with telehealth technology discussing the above problems.     Medication Adjustments/Labs and Tests Ordered: Current medicines are reviewed at length with the patient today.  Concerns regarding medicines are outlined above.   Tests Ordered: No orders of the defined types were placed in this encounter.   Medication Changes: No orders of the defined types were placed in this  encounter.   Follow Up:  Virtual Visit or In Person in 1 year(s)  Signed, Kate Sable, MD  08/22/2019 9:37 AM    [place]

## 2019-08-22 NOTE — Telephone Encounter (Signed)
CSW referred to assist patient with obtaining a scale and a BP cuff. CSW contacted patient to inform cuff and scale will be delivered to home. Patient grateful for support and assistance. CSW available as needed. Jackie Ramin Zoll, LCSW, CCSW-MCS [phone]  

## 2019-08-22 NOTE — Patient Instructions (Signed)
Medication Instructions: Your physician recommends that you continue on your current medications as directed. Please refer to the Current Medication list given to you today.   Labwork: none  Procedures/Testing: None  Follow-Up: 1 year with Dr.Koneswaran  Any Additional Special Instructions Will Be Listed Below (If Applicable).     If you need a refill on your cardiac medications before your next appointment, please call your pharmacy.     Thank you for choosing [facility] !        

## 2019-09-01 ENCOUNTER — Other Ambulatory Visit: Payer: Self-pay | Admitting: Cardiovascular Disease

## 2019-09-11 ENCOUNTER — Other Ambulatory Visit: Payer: Self-pay | Admitting: Cardiovascular Disease

## 2019-09-23 ENCOUNTER — Other Ambulatory Visit: Payer: Self-pay | Admitting: Cardiovascular Disease

## 2019-10-06 IMAGING — DX LEFT HUMERUS - 2+ VIEW
4 series · 4 of 4 positions shown · non-contrast
Comparison: None.

CLINICAL DATA: Left upper arm pain after possible injury yesterday.

EXAM:
LEFT HUMERUS - 2+ VIEW

[humerus ap]
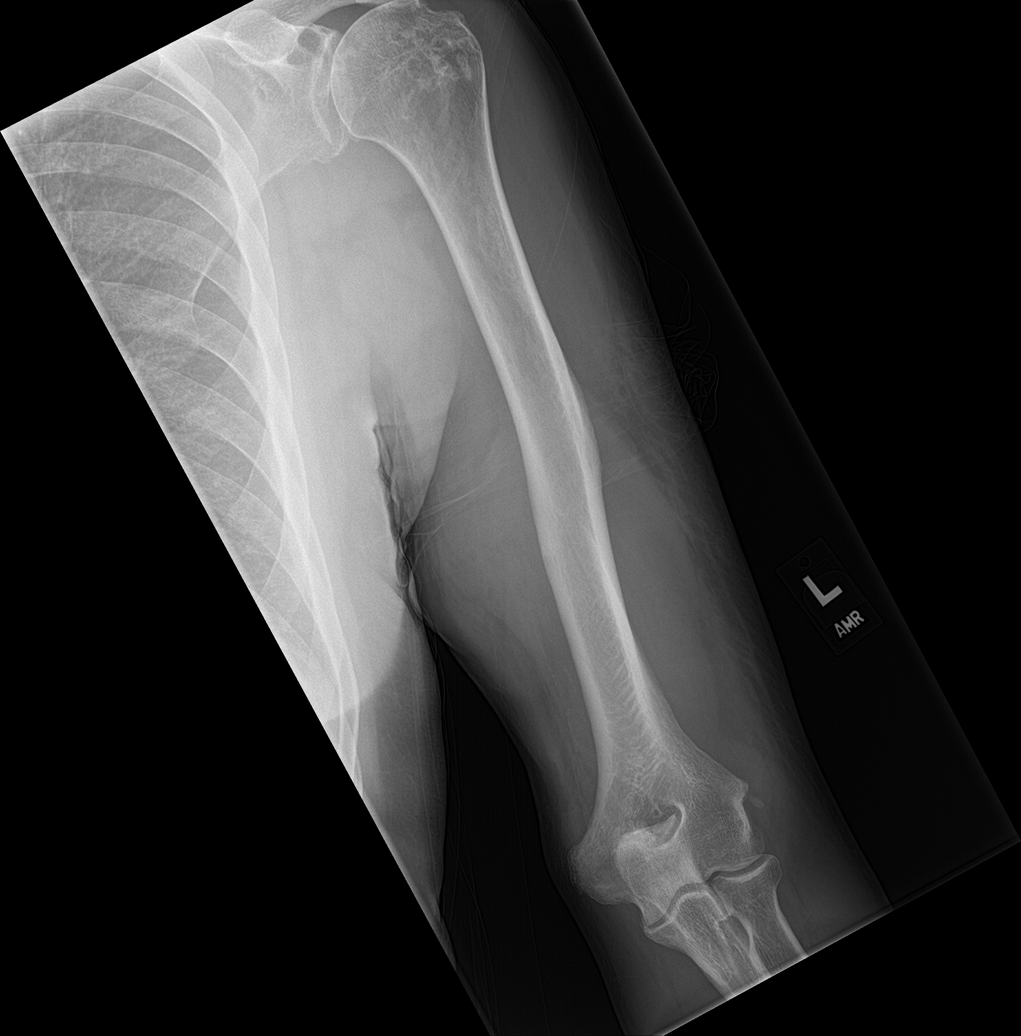

[humerus lat (1 of 3)]
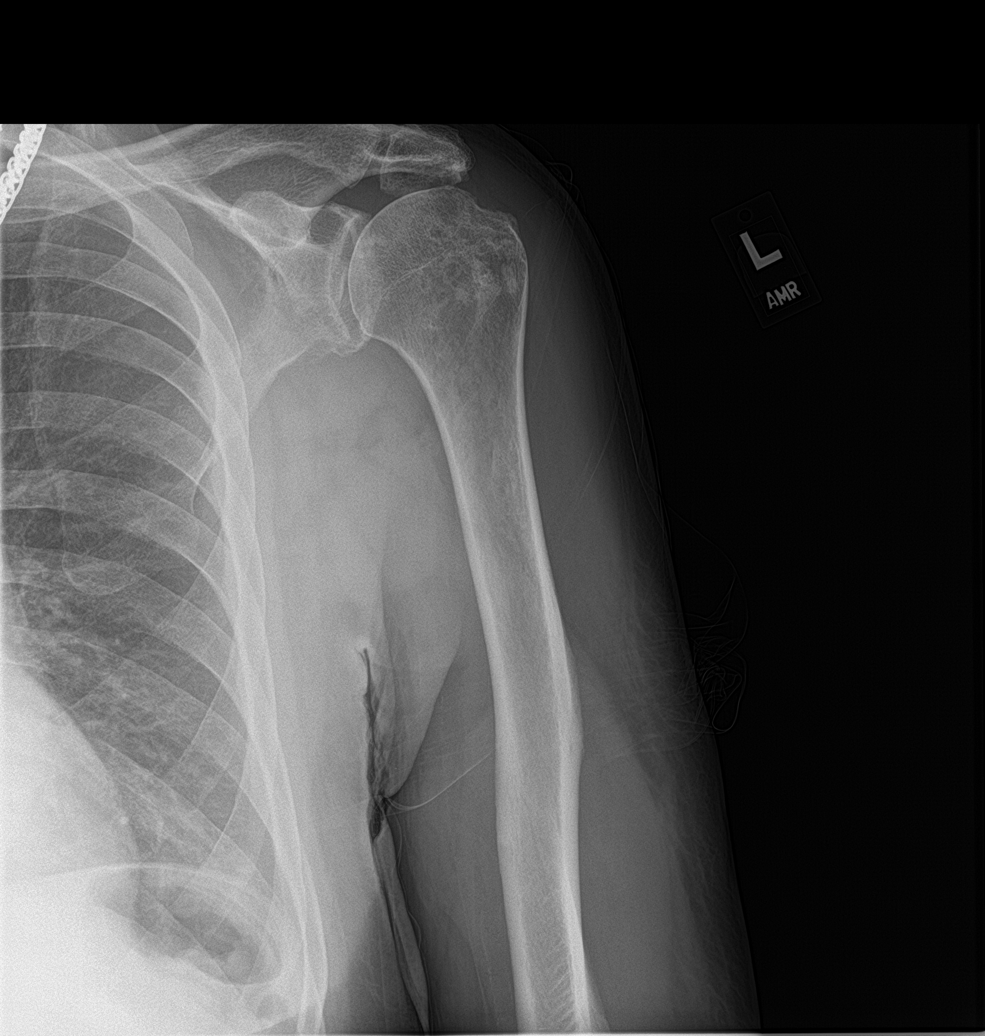

[humerus lat (2 of 3)]
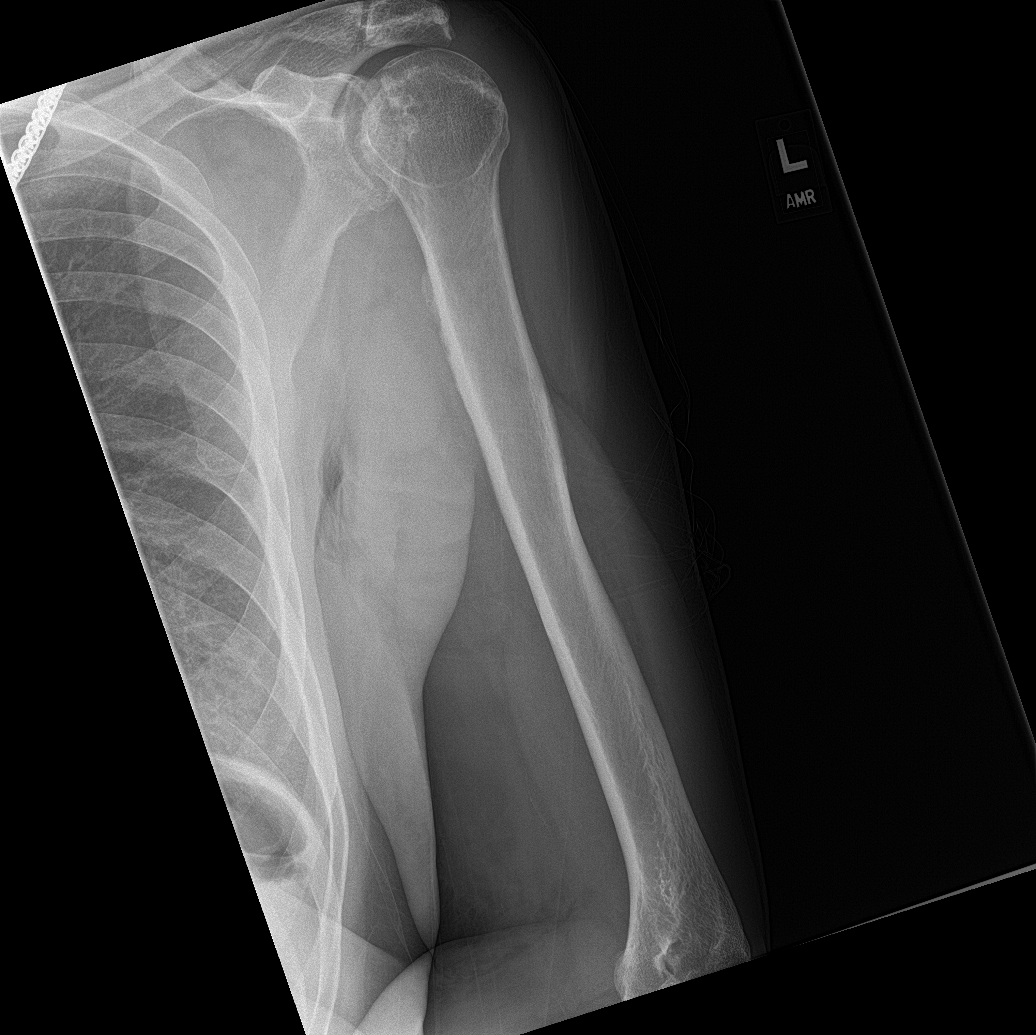

[humerus lat (3 of 3)]
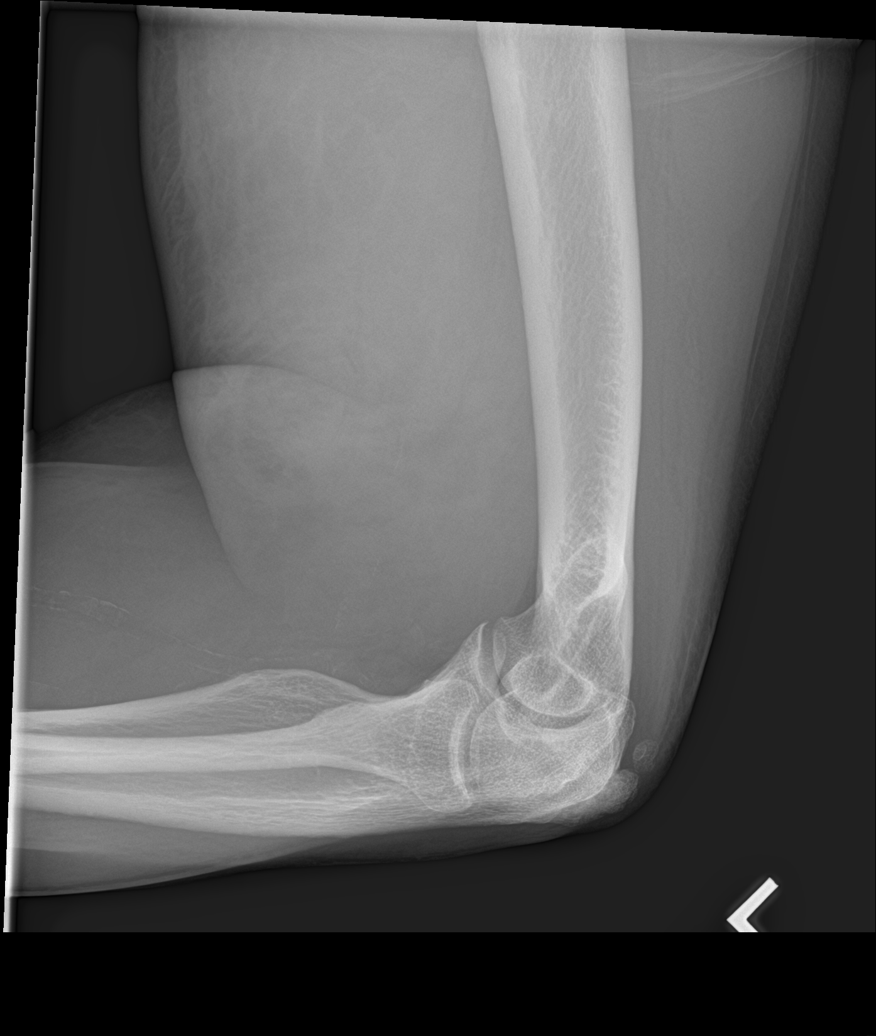

[4 of 4 positions shown; findings below may reference images not displayed]

FINDINGS: There is no evidence of fracture or other focal bone lesions. Soft
tissues are unremarkable.
IMPRESSION: Negative.

## 2019-10-23 ENCOUNTER — Other Ambulatory Visit: Payer: Self-pay | Admitting: Cardiovascular Disease

## 2020-01-15 ENCOUNTER — Other Ambulatory Visit: Payer: Self-pay | Admitting: Cardiovascular Disease

## 2020-03-02 ENCOUNTER — Other Ambulatory Visit: Payer: Self-pay | Admitting: Cardiovascular Disease

## 2020-03-16 ENCOUNTER — Other Ambulatory Visit: Payer: Self-pay | Admitting: Cardiovascular Disease

## 2020-04-08 ENCOUNTER — Other Ambulatory Visit: Payer: Self-pay

## 2020-04-08 ENCOUNTER — Ambulatory Visit (INDEPENDENT_AMBULATORY_CARE_PROVIDER_SITE_OTHER): Payer: Medicare HMO | Admitting: Pharmacist

## 2020-04-08 DIAGNOSIS — Z952 Presence of prosthetic heart valve: Secondary | ICD-10-CM

## 2020-04-08 DIAGNOSIS — Z7901 Long term (current) use of anticoagulants: Secondary | ICD-10-CM

## 2020-04-08 DIAGNOSIS — Z5181 Encounter for therapeutic drug level monitoring: Secondary | ICD-10-CM | POA: Diagnosis not present

## 2020-04-08 LAB — POCT INR: INR: 1.8 — AB (ref 2.0–3.0)

## 2020-04-08 NOTE — Patient Instructions (Signed)
Description   Take 3 tablets of warfarin tonight and tomorrow, then resume 2 tablets daily except 1 tablet on Mondays  Recheck in 1-2 weeks

## 2020-04-09 ENCOUNTER — Other Ambulatory Visit: Payer: Self-pay | Admitting: Cardiovascular Disease

## 2020-04-21 ENCOUNTER — Other Ambulatory Visit: Payer: Self-pay

## 2020-04-21 ENCOUNTER — Ambulatory Visit (INDEPENDENT_AMBULATORY_CARE_PROVIDER_SITE_OTHER): Payer: Medicare HMO | Admitting: *Deleted

## 2020-04-21 DIAGNOSIS — Z952 Presence of prosthetic heart valve: Secondary | ICD-10-CM

## 2020-04-21 DIAGNOSIS — Z7901 Long term (current) use of anticoagulants: Secondary | ICD-10-CM

## 2020-04-21 DIAGNOSIS — Z5181 Encounter for therapeutic drug level monitoring: Secondary | ICD-10-CM

## 2020-04-21 LAB — POCT INR: INR: 3.3 — AB (ref 2.0–3.0)

## 2020-04-21 NOTE — Patient Instructions (Signed)
Continue warfarin 2 tablets daily except 1 tablet on Mondays  Recheck in 4 weeks 

## 2020-05-19 ENCOUNTER — Other Ambulatory Visit: Payer: Self-pay | Admitting: Cardiovascular Disease

## 2020-05-19 ENCOUNTER — Ambulatory Visit (INDEPENDENT_AMBULATORY_CARE_PROVIDER_SITE_OTHER): Payer: Medicare HMO | Admitting: *Deleted

## 2020-05-19 DIAGNOSIS — Z7901 Long term (current) use of anticoagulants: Secondary | ICD-10-CM

## 2020-05-19 DIAGNOSIS — Z952 Presence of prosthetic heart valve: Secondary | ICD-10-CM | POA: Diagnosis not present

## 2020-05-19 DIAGNOSIS — Z79899 Other long term (current) drug therapy: Secondary | ICD-10-CM

## 2020-05-19 DIAGNOSIS — Z5181 Encounter for therapeutic drug level monitoring: Secondary | ICD-10-CM | POA: Diagnosis not present

## 2020-05-19 LAB — POCT INR: INR: 1.4 — AB (ref 2.0–3.0)

## 2020-05-19 MED ORDER — WARFARIN SODIUM 5 MG PO TABS: ORAL_TABLET | ORAL | 3 refills | Status: DC

## 2020-05-19 NOTE — Telephone Encounter (Signed)
   Can send in refill for Ramipril on this patient. He has not had a CBC or BMET in over one year and is on this, Triamterene-HCTZ and Coumadin. If he has a PCP, please request records but he is not listed as having one. If no PCP, please enter lab order for CBC and BMET for medication management.  Signed, Ellsworth Lennox, PA-C 05/19/2020, 1:39 PM Pager: [phone]

## 2020-05-19 NOTE — Patient Instructions (Signed)
Take tablets 3 tablets tonight and tomorrow then resume 2 tablets daily except 1 tablet on Mondays  Recheck in 3 weeks

## 2020-06-09 ENCOUNTER — Ambulatory Visit (INDEPENDENT_AMBULATORY_CARE_PROVIDER_SITE_OTHER): Payer: Medicare HMO | Admitting: *Deleted

## 2020-06-09 DIAGNOSIS — Z5181 Encounter for therapeutic drug level monitoring: Secondary | ICD-10-CM | POA: Diagnosis not present

## 2020-06-09 DIAGNOSIS — Z952 Presence of prosthetic heart valve: Secondary | ICD-10-CM

## 2020-06-09 DIAGNOSIS — Z7901 Long term (current) use of anticoagulants: Secondary | ICD-10-CM

## 2020-06-09 LAB — POCT INR: INR: 2.1 (ref 2.0–3.0)

## 2020-06-09 NOTE — Patient Instructions (Signed)
Take 3 tablets tonight then increase dose to 2 tablets daily Recheck in 4 weeks

## 2020-08-17 ENCOUNTER — Telehealth: Payer: Self-pay

## 2020-08-17 NOTE — Telephone Encounter (Addendum)
Received fax from [place] office, requesting refill on Warfarin be sent to [facility].  Pt is Overdue for follow-up last seen 7/21/2, due for recheck 07/07/20.  Pt No Showed for this appt. Attempted to call pt at number listed in chart, voicemail states it's for Maralyn Sago, and it is full and unable to leave message.  Unsure how to contact pt, attempted to call daughter's number in chart, NA left message that we are trying to get in contact with her father [name] regarding a rx refill. Pt had been filling Warfarin for 30 day supply at [facility], but now requesting 90 day supply from [facility].  Pt has hx of non compliance 90 day supply not appropriate. Will advise pt os this once we can get in touch with him.   Pt is overdue for follow-up, needs appt will send message to Vashti Hey, RN to contact pt for ROV.

## 2020-08-18 ENCOUNTER — Other Ambulatory Visit: Payer: Self-pay | Admitting: *Deleted

## 2020-08-18 MED ORDER — AMLODIPINE BESYLATE 10 MG PO TABS: 10.0000 mg | ORAL_TABLET | Freq: Every day | ORAL | 0 refills | Status: DC

## 2020-08-18 MED ORDER — CARVEDILOL 25 MG PO TABS: 25.0000 mg | ORAL_TABLET | Freq: Every day | ORAL | 0 refills | Status: DC

## 2020-08-18 MED ORDER — SIMVASTATIN 40 MG PO TABS: 40.0000 mg | ORAL_TABLET | Freq: Every day | ORAL | 0 refills | Status: DC

## 2020-08-18 MED ORDER — RAMIPRIL 10 MG PO CAPS: 10.0000 mg | ORAL_CAPSULE | Freq: Every day | ORAL | 0 refills | Status: DC

## 2020-08-18 NOTE — Telephone Encounter (Signed)
Spoke with pt.  Phone number in chart was incorrect but correct now.  INR appt made for 08/23/20 at 9:45.  States he has enough warfarin to last till then.  Will address refills at appt.  Pt verbalized understanding.

## 2020-08-20 ENCOUNTER — Other Ambulatory Visit: Payer: Self-pay

## 2020-08-20 MED ORDER — TAMSULOSIN HCL 0.4 MG PO CAPS: 0.4000 mg | ORAL_CAPSULE | Freq: Every day | ORAL | 0 refills | Status: DC

## 2020-08-20 MED ORDER — TRIAMTERENE-HCTZ 37.5-25 MG PO TABS: ORAL_TABLET | ORAL | 0 refills | Status: DC

## 2020-08-20 NOTE — Telephone Encounter (Signed)
[facility]illed [facility]s to [facility], needs fu apt to get refills

## 2020-08-23 ENCOUNTER — Ambulatory Visit (INDEPENDENT_AMBULATORY_CARE_PROVIDER_SITE_OTHER): Payer: Medicare HMO | Admitting: *Deleted

## 2020-08-23 DIAGNOSIS — Z952 Presence of prosthetic heart valve: Secondary | ICD-10-CM

## 2020-08-23 DIAGNOSIS — Z7901 Long term (current) use of anticoagulants: Secondary | ICD-10-CM

## 2020-08-23 DIAGNOSIS — Z5181 Encounter for therapeutic drug level monitoring: Secondary | ICD-10-CM

## 2020-08-23 LAB — POCT INR: INR: 1.6 — AB (ref 2.0–3.0)

## 2020-08-23 MED ORDER — TAMSULOSIN HCL 0.4 MG PO CAPS: 0.4000 mg | ORAL_CAPSULE | Freq: Every day | ORAL | 1 refills | Status: DC

## 2020-08-23 MED ORDER — WARFARIN SODIUM 5 MG PO TABS: ORAL_TABLET | ORAL | 0 refills | Status: DC

## 2020-08-23 NOTE — Patient Instructions (Signed)
Take 3 tablets tonight and tomorrow night then increase dose to 2 tablets daily except 3 tablets on Wednesdays and Thursdays Recheck in 2 weeks

## 2020-08-26 ENCOUNTER — Other Ambulatory Visit: Payer: Self-pay

## 2020-08-26 MED ORDER — CARVEDILOL 25 MG PO TABS: 25.0000 mg | ORAL_TABLET | Freq: Every day | ORAL | 1 refills | Status: DC

## 2020-08-26 MED ORDER — AMLODIPINE BESYLATE 10 MG PO TABS: 10.0000 mg | ORAL_TABLET | Freq: Every day | ORAL | 1 refills | Status: DC

## 2020-08-26 MED ORDER — RAMIPRIL 10 MG PO CAPS: 10.0000 mg | ORAL_CAPSULE | Freq: Every day | ORAL | 1 refills | Status: DC

## 2020-08-26 NOTE — Telephone Encounter (Signed)
Refilled Coreg 25 mg bid, Norvasc 10 mg qd, and Ramipril 10 mg qd to [facility]   Pt has apt on 09/07/20 with A.Quinn,NP

## 2020-09-06 NOTE — Progress Notes (Signed)
Cardiology Office Note  Date: 09/07/2020   ID: [name], DOB 22-Sep-1951, MRN [id number]  PCP:  Patient, No Pcp Per  Cardiologist:  No primary care provider on file. Electrophysiologist:  None   Chief Complaint: status post AVR  History of Present Illness: [name] is a 69 y.o. male with a history of AVR, HTN, HLD, previous cardiac catheterization: Normal coronary arteries.  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  Last visit with Dr. Purvis Sheffield 08/2019.  Denied any chest pain, palpitations, shortness of breath, lightheadedness, dizziness, leg swelling, orthopnea, PND, syncope.  He was symptomatically stable and had a normally functioning prosthetic aortic valve by echo 2017.  He was continued on Coumadin for anticoagulation.  No changes in antihypertensive therapy.  He was continuing simvastatin 40 mg daily.  Lipids were normal June 2019.  Denied any bleeding problems on anticoagulation.  Was continuing carvedilol and ramipril secondary to cardiomyopathy.  He presents today for 1 year follow-up.  He denies any recent acute illnesses or hospitalizations.  States he has been doing exercises at home to stay in shape.  He denies any anginal or exertional symptoms, palpitations or arrhythmias, lightheadedness, dizziness, presyncope or syncope.  Denies any PND, orthopnea, bleeding, CVA or TIA like symptom.  No claudication-like symptoms, DVT or PE-like symptoms, or lower extremity edema.  He has no PCP and states he has not had any lab work in quite some time.   Past Medical History:  Diagnosis Date  . Coronary artery disease   . Hypertension   . Poor historian   . Sexual dysfunction     Past Surgical History:  Procedure Laterality Date  . CIRCUMCISION  2006   [place]--which was uncomplicated  . CORONARY  ANGIOPLASTY WITH STENT PLACEMENT    . EXPLORATION POST OPERATIVE OPEN HEART     open heart surgery    Current Outpatient Medications  Medication Sig Dispense Refill  . amLODipine (NORVASC) 10 MG tablet Take 1 tablet (10 mg total) by mouth daily. 90 tablet 1  . carvedilol (COREG) 25 MG tablet Take 1 tablet (25 mg total) by mouth daily. 90 tablet 1  . HYDROcodone-acetaminophen (NORCO/VICODIN) 5-325 MG tablet Take 1 tablet by mouth every 6 (six) hours as needed for severe pain. 6 tablet 0  . ramipril (ALTACE) 10 MG capsule Take 1 capsule (10 mg total) by mouth daily. 90 capsule 1  . simvastatin (ZOCOR) 40 MG tablet Take 1 tablet (40 mg total) by mouth at bedtime. 30 tablet 0  . tamsulosin (FLOMAX) 0.4 MG CAPS capsule Take 1 capsule (0.4 mg total) by mouth at bedtime. 90 capsule 1  . triamterene-hydrochlorothiazide (MAXZIDE-25) 37.5-25 MG tablet 1 tablet by mouth, three times a week 12 tablet 0  . warfarin (COUMADIN) 5 MG tablet Take 2 tablets daily except 3 tablets on Wednesdays and Saturdays or as directed 200 tablet 0   No current facility-administered medications for this visit.   Allergies:  Iohexol, Aspirin, and Contrast media [iodinated diagnostic agents]   Social History: The patient  reports that he has never smoked. He has never used smokeless tobacco. He reports current drug use. Drug: Marijuana. He reports that he does not drink alcohol.   Family History: The patient's family history includes Alcoholism in his father; Diabetes in his mother; Hypertension in his mother.   ROS:  Please see  the history of present illness. Otherwise, complete review of systems is positive for none.  All other systems are reviewed and negative.   Physical Exam: VS:  BP 128/86   Pulse (!) 53   Ht 6\' 1"  (1.854 m)   Wt 240 lb 3.2 oz (109 kg)   SpO2 98%   BMI 31.69 kg/m , BMI Body mass index is 31.69 kg/m.  Wt Readings from Last 3 Encounters:  09/07/20 240 lb 3.2 oz (109 kg)  05/13/19 260 lb  (117.9 kg)  08/10/18 240 lb (108.9 kg)    General: Patient appears comfortable at rest. Neck: Supple, no elevated JVP or carotid bruits, no thyromegaly. Lungs: Clear to auscultation, nonlabored breathing at rest. Cardiac: Regular rate and rhythm, no S3 or significant systolic murmur, no pericardial rub. Extremities: No pitting edema, distal pulses 2+. Skin: Warm and dry. Musculoskeletal: No kyphosis. Neuropsychiatric: Alert and oriented x3, affect grossly appropriate.  ECG: EKG machine is not functional today.  Unable to perform EKG  Recent Labwork: No results found for requested labs within last 8760 hours.     Component Value Date/Time   CHOL 180 04/29/2018 1032   TRIG 67 04/29/2018 1032   HDL 72 04/29/2018 1032   CHOLHDL 2.5 04/29/2018 1032   VLDL 13 04/29/2018 1032   LDLCALC 95 04/29/2018 1032    Other Studies Reviewed Today:  Most recent echocardiogram performed on 07/16/2016 demonstrated mildly reduced left ventricular systolic function, LVEF 45 to 07/18/2016, moderate LVH, normal regional wall motion, normally functioning tilting-disc aortic valve prosthesis, moderate biatrial dilatation, and mildly dilated right ventricle   Assessment and Plan:  1. Status post aortic valve replacement   2. Essential hypertension   3. Mixed hyperlipidemia   4. Cardiomyopathy, unspecified type (HCC)   5. Chronic anticoagulation    1. Status post aortic valve replacement Last echocardiogram performed in 2017 demonstrated mildly reduced EF 45 to 50% with moderate LVH.  Normal functioning tilting-disc aortic valve prosthesis.  Patient denies any symptoms related to valve replacement.  2. Essential hypertension Blood pressure reasonably well controlled today at 128/86.  Continue carvedilol 25 mg p.o. daily, ramipril 10 mg daily.  Triamterene/hydrochlorothiazide 37.5/25 mg 3 times a week.  Continue amlodipine 10 mg daily.  Please get CBC and basic metabolic panel.  Patient has no PCP and has not  had lab work in quite some time. 3. Mixed hyperlipidemia Continue simvastatin 40 mg daily.  Please get a lipid profile with LFTs.  Patient has not had lab work due to not having PCP and quite some time.  4. Cardiomyopathy, unspecified type (HCC) Last EF on June 28, 2719 1745 to 50%.  Patient denies any shortness of breath, weight gain, or lower extremity edema.  Continue carvedilol 25 mg p.o. daily, ramipril 10 mg daily, triamterene/hydrochlorothiazide 37.5/25 mg 3 times per week.  5. Chronic anticoagulation On chronic Coumadin 5 mg 2 tablets daily except 3 tablets on Wednesdays and Saturdays.  Follows in Coumadin clinic.  Continue Coumadin as directed.  Medication Adjustments/Labs and Tests Ordered: Current medicines are reviewed at length with the patient today.  Concerns regarding medicines are outlined above.   Disposition: Follow-up with Dr. 02-15-1991 or APP 1 year  Signed, Wyline Mood, NP 09/07/2020 9:21 AM    [facility] [address] [place], [place], [place] Phone: [phone]; Fax: [phone]

## 2020-09-07 ENCOUNTER — Encounter: Payer: Self-pay | Admitting: Family Medicine

## 2020-09-07 ENCOUNTER — Ambulatory Visit (INDEPENDENT_AMBULATORY_CARE_PROVIDER_SITE_OTHER): Payer: Medicare HMO | Admitting: Family Medicine

## 2020-09-07 VITALS — BP 128/86 | HR 53 | Ht 73.0 in | Wt 240.2 lb

## 2020-09-07 DIAGNOSIS — E782 Mixed hyperlipidemia: Secondary | ICD-10-CM | POA: Diagnosis not present

## 2020-09-07 DIAGNOSIS — Z7901 Long term (current) use of anticoagulants: Secondary | ICD-10-CM

## 2020-09-07 DIAGNOSIS — Z952 Presence of prosthetic heart valve: Secondary | ICD-10-CM

## 2020-09-07 DIAGNOSIS — I10 Essential (primary) hypertension: Secondary | ICD-10-CM | POA: Diagnosis not present

## 2020-09-07 DIAGNOSIS — I429 Cardiomyopathy, unspecified: Secondary | ICD-10-CM

## 2020-09-07 NOTE — Patient Instructions (Signed)
Medication Instructions:  Continue all current medications.  Labwork:  CBC, BMET, Lipids - orders given today.  Office will contact with results via phone or letter.    Reminder:  Nothing to eat or drink after 12 midnight prior to labs.  Testing/Procedures: none  Follow-Up: Your physician wants you to follow up in:  1 year.  You will receive a reminder letter in the mail one-two months in advance.  If you don't receive a letter, please call our office to schedule the follow up appointment.  Any Other Special Instructions Will Be Listed Below (If Applicable).  If you need a refill on your cardiac medications before your next appointment, please call your pharmacy.

## 2020-10-12 ENCOUNTER — Ambulatory Visit (INDEPENDENT_AMBULATORY_CARE_PROVIDER_SITE_OTHER): Payer: Medicare HMO | Admitting: *Deleted

## 2020-10-12 ENCOUNTER — Other Ambulatory Visit: Payer: Self-pay

## 2020-10-12 DIAGNOSIS — Z952 Presence of prosthetic heart valve: Secondary | ICD-10-CM

## 2020-10-12 DIAGNOSIS — Z5181 Encounter for therapeutic drug level monitoring: Secondary | ICD-10-CM

## 2020-10-12 DIAGNOSIS — Z7901 Long term (current) use of anticoagulants: Secondary | ICD-10-CM | POA: Diagnosis not present

## 2020-10-12 LAB — POCT INR: INR: 2.1 (ref 2.0–3.0)

## 2020-10-12 NOTE — Patient Instructions (Signed)
Increase warfarin to 2 tablets daily except 3 tablets on Tuesdays, Thursdays and Saturdays Recheck in 3 weeks

## 2020-11-08 ENCOUNTER — Other Ambulatory Visit: Payer: Self-pay | Admitting: Student

## 2020-11-08 NOTE — Telephone Encounter (Signed)
This is a [place] pt °

## 2020-11-29 ENCOUNTER — Ambulatory Visit (INDEPENDENT_AMBULATORY_CARE_PROVIDER_SITE_OTHER): Payer: Medicare HMO | Admitting: *Deleted

## 2020-11-29 DIAGNOSIS — Z5181 Encounter for therapeutic drug level monitoring: Secondary | ICD-10-CM

## 2020-11-29 DIAGNOSIS — Z952 Presence of prosthetic heart valve: Secondary | ICD-10-CM | POA: Diagnosis not present

## 2020-11-29 DIAGNOSIS — Z7901 Long term (current) use of anticoagulants: Secondary | ICD-10-CM

## 2020-11-29 LAB — POCT INR: INR: 3.9 — AB (ref 2.0–3.0)

## 2020-11-29 NOTE — Patient Instructions (Signed)
Hold warfarin tonight then resume 2 tablets daily except 3 tablets on Tuesdays, Thursdays and Saturdays Recheck in 3 weeks

## 2021-01-12 ENCOUNTER — Ambulatory Visit (INDEPENDENT_AMBULATORY_CARE_PROVIDER_SITE_OTHER): Payer: Medicare HMO | Admitting: *Deleted

## 2021-01-12 ENCOUNTER — Other Ambulatory Visit: Payer: Self-pay | Admitting: Family Medicine

## 2021-01-12 DIAGNOSIS — Z5181 Encounter for therapeutic drug level monitoring: Secondary | ICD-10-CM

## 2021-01-12 DIAGNOSIS — Z7901 Long term (current) use of anticoagulants: Secondary | ICD-10-CM | POA: Diagnosis not present

## 2021-01-12 DIAGNOSIS — Z952 Presence of prosthetic heart valve: Secondary | ICD-10-CM

## 2021-01-12 LAB — POCT INR: INR: 1.6 — AB (ref 2.0–3.0)

## 2021-01-12 MED ORDER — WARFARIN SODIUM 5 MG PO TABS: ORAL_TABLET | ORAL | 1 refills | Status: DC

## 2021-01-12 MED ORDER — WARFARIN SODIUM 5 MG PO TABS: ORAL_TABLET | ORAL | 0 refills | Status: DC

## 2021-01-12 NOTE — Patient Instructions (Signed)
Take warfarin 4 tablets tonight then resume 2 tablets daily except 3 tablets on Tuesdays, Thursdays and Saturdays Recheck in 2 weeks

## 2021-01-26 ENCOUNTER — Ambulatory Visit (INDEPENDENT_AMBULATORY_CARE_PROVIDER_SITE_OTHER): Payer: Medicare HMO | Admitting: *Deleted

## 2021-01-26 DIAGNOSIS — Z952 Presence of prosthetic heart valve: Secondary | ICD-10-CM | POA: Diagnosis not present

## 2021-01-26 DIAGNOSIS — Z7901 Long term (current) use of anticoagulants: Secondary | ICD-10-CM | POA: Diagnosis not present

## 2021-01-26 DIAGNOSIS — Z5181 Encounter for therapeutic drug level monitoring: Secondary | ICD-10-CM | POA: Diagnosis not present

## 2021-01-26 LAB — POCT INR: INR: 4.3 — AB (ref 2.0–3.0)

## 2021-01-26 NOTE — Patient Instructions (Signed)
Hold warfarin tonight then continue 2 tablets daily except 3 tablets on Tuesdays, Thursdays and Saturdays Recheck in 3 weeks

## 2021-02-02 ENCOUNTER — Other Ambulatory Visit: Payer: Self-pay | Admitting: Family Medicine

## 2021-02-21 ENCOUNTER — Other Ambulatory Visit: Payer: Self-pay

## 2021-02-21 ENCOUNTER — Ambulatory Visit (INDEPENDENT_AMBULATORY_CARE_PROVIDER_SITE_OTHER): Payer: Medicare HMO | Admitting: *Deleted

## 2021-02-21 DIAGNOSIS — Z5181 Encounter for therapeutic drug level monitoring: Secondary | ICD-10-CM | POA: Diagnosis not present

## 2021-02-21 DIAGNOSIS — Z952 Presence of prosthetic heart valve: Secondary | ICD-10-CM

## 2021-02-21 DIAGNOSIS — Z7901 Long term (current) use of anticoagulants: Secondary | ICD-10-CM | POA: Diagnosis not present

## 2021-02-21 LAB — POCT INR: INR: 1.6 — AB (ref 2.0–3.0)

## 2021-02-21 MED ORDER — SIMVASTATIN 40 MG PO TABS: 40.0000 mg | ORAL_TABLET | Freq: Every day | ORAL | 3 refills | Status: DC

## 2021-02-21 NOTE — Patient Instructions (Signed)
Take warfarin 4 tablets today then resume 2 tablets daily except 3 tablets on Tuesdays, Thursdays and Saturdays Recheck in 2 weeks

## 2021-03-08 ENCOUNTER — Ambulatory Visit (INDEPENDENT_AMBULATORY_CARE_PROVIDER_SITE_OTHER): Payer: Medicare HMO | Admitting: *Deleted

## 2021-03-08 DIAGNOSIS — Z952 Presence of prosthetic heart valve: Secondary | ICD-10-CM

## 2021-03-08 DIAGNOSIS — Z5181 Encounter for therapeutic drug level monitoring: Secondary | ICD-10-CM | POA: Diagnosis not present

## 2021-03-08 DIAGNOSIS — Z7901 Long term (current) use of anticoagulants: Secondary | ICD-10-CM | POA: Diagnosis not present

## 2021-03-08 LAB — POCT INR: INR: 4.5 — AB (ref 2.0–3.0)

## 2021-03-08 NOTE — Patient Instructions (Signed)
Hold warfarin tonight then resume 2 tablets daily except 3 tablets on Tuesdays, Thursdays and Saturdays Recheck in 3 weeks

## 2021-03-29 ENCOUNTER — Ambulatory Visit (INDEPENDENT_AMBULATORY_CARE_PROVIDER_SITE_OTHER): Payer: Medicare HMO | Admitting: *Deleted

## 2021-03-29 DIAGNOSIS — Z952 Presence of prosthetic heart valve: Secondary | ICD-10-CM

## 2021-03-29 DIAGNOSIS — Z5181 Encounter for therapeutic drug level monitoring: Secondary | ICD-10-CM | POA: Diagnosis not present

## 2021-03-29 DIAGNOSIS — Z7901 Long term (current) use of anticoagulants: Secondary | ICD-10-CM

## 2021-03-29 LAB — POCT INR: INR: 4.2 — AB (ref 2.0–3.0)

## 2021-03-29 NOTE — Patient Instructions (Signed)
Decrease warfarin to 2 tablets daily except 3 tablets on Saturdays Recheck in 3 weeks

## 2021-04-21 ENCOUNTER — Ambulatory Visit (INDEPENDENT_AMBULATORY_CARE_PROVIDER_SITE_OTHER): Payer: Medicare HMO | Admitting: *Deleted

## 2021-04-21 DIAGNOSIS — Z5181 Encounter for therapeutic drug level monitoring: Secondary | ICD-10-CM | POA: Diagnosis not present

## 2021-04-21 DIAGNOSIS — Z952 Presence of prosthetic heart valve: Secondary | ICD-10-CM | POA: Diagnosis not present

## 2021-04-21 DIAGNOSIS — Z7901 Long term (current) use of anticoagulants: Secondary | ICD-10-CM | POA: Diagnosis not present

## 2021-04-21 LAB — POCT INR: INR: 3.3 — AB (ref 2.0–3.0)

## 2021-04-21 NOTE — Patient Instructions (Signed)
Continue warfarin 2 tablets daily except 3 tablets on Saturdays Recheck in 4 weeks 

## 2021-05-19 ENCOUNTER — Ambulatory Visit (INDEPENDENT_AMBULATORY_CARE_PROVIDER_SITE_OTHER): Payer: Medicare HMO | Admitting: *Deleted

## 2021-05-19 DIAGNOSIS — Z5181 Encounter for therapeutic drug level monitoring: Secondary | ICD-10-CM | POA: Diagnosis not present

## 2021-05-19 DIAGNOSIS — Z7901 Long term (current) use of anticoagulants: Secondary | ICD-10-CM

## 2021-05-19 DIAGNOSIS — Z952 Presence of prosthetic heart valve: Secondary | ICD-10-CM | POA: Diagnosis not present

## 2021-05-19 LAB — POCT INR: INR: 2.9 (ref 2.0–3.0)

## 2021-05-19 NOTE — Patient Instructions (Signed)
Continue warfarin 2 tablets daily except 3 tablets on Saturdays Recheck in 4 weeks

## 2021-06-14 ENCOUNTER — Other Ambulatory Visit: Payer: Self-pay | Admitting: Family Medicine

## 2021-06-20 ENCOUNTER — Other Ambulatory Visit: Payer: Self-pay | Admitting: Family Medicine

## 2021-06-21 ENCOUNTER — Ambulatory Visit (INDEPENDENT_AMBULATORY_CARE_PROVIDER_SITE_OTHER): Payer: Medicare HMO | Admitting: *Deleted

## 2021-06-21 DIAGNOSIS — Z7901 Long term (current) use of anticoagulants: Secondary | ICD-10-CM | POA: Diagnosis not present

## 2021-06-21 DIAGNOSIS — Z5181 Encounter for therapeutic drug level monitoring: Secondary | ICD-10-CM | POA: Diagnosis not present

## 2021-06-21 DIAGNOSIS — Z952 Presence of prosthetic heart valve: Secondary | ICD-10-CM | POA: Diagnosis not present

## 2021-06-21 LAB — POCT INR: INR: 5.7 — AB (ref 2.0–3.0)

## 2021-06-21 NOTE — Patient Instructions (Signed)
Hold warfarin tonight and tomorrow then resume 2 tablets daily except 3 tablets on Saturdays Recheck in 2 weeks

## 2021-07-04 ENCOUNTER — Ambulatory Visit (INDEPENDENT_AMBULATORY_CARE_PROVIDER_SITE_OTHER): Payer: Medicare HMO | Admitting: *Deleted

## 2021-07-04 DIAGNOSIS — Z5181 Encounter for therapeutic drug level monitoring: Secondary | ICD-10-CM

## 2021-07-04 DIAGNOSIS — Z7901 Long term (current) use of anticoagulants: Secondary | ICD-10-CM

## 2021-07-04 DIAGNOSIS — Z952 Presence of prosthetic heart valve: Secondary | ICD-10-CM

## 2021-07-04 LAB — POCT INR: INR: 3.2 — AB (ref 2.0–3.0)

## 2021-07-04 NOTE — Patient Instructions (Signed)
Continue warfarin 2 tablets daily except 3 tablets on Saturdays Recheck in 4 weeks 

## 2021-08-01 ENCOUNTER — Ambulatory Visit (INDEPENDENT_AMBULATORY_CARE_PROVIDER_SITE_OTHER): Payer: Medicare HMO | Admitting: *Deleted

## 2021-08-01 DIAGNOSIS — Z7901 Long term (current) use of anticoagulants: Secondary | ICD-10-CM

## 2021-08-01 DIAGNOSIS — Z5181 Encounter for therapeutic drug level monitoring: Secondary | ICD-10-CM

## 2021-08-01 DIAGNOSIS — Z952 Presence of prosthetic heart valve: Secondary | ICD-10-CM

## 2021-08-01 LAB — POCT INR: INR: 2 (ref 2.0–3.0)

## 2021-08-01 MED ORDER — AMLODIPINE BESYLATE 10 MG PO TABS: 10.0000 mg | ORAL_TABLET | Freq: Every day | ORAL | 1 refills | Status: DC

## 2021-08-01 NOTE — Patient Instructions (Signed)
Take 3 tablet tonight and tomorrow night then resume 2 tablets daily except 3 tablets on Saturdays Recheck in 3 weeks

## 2021-08-22 ENCOUNTER — Ambulatory Visit (INDEPENDENT_AMBULATORY_CARE_PROVIDER_SITE_OTHER): Payer: Medicare HMO | Admitting: *Deleted

## 2021-08-22 ENCOUNTER — Other Ambulatory Visit: Payer: Self-pay

## 2021-08-22 DIAGNOSIS — Z5181 Encounter for therapeutic drug level monitoring: Secondary | ICD-10-CM

## 2021-08-22 DIAGNOSIS — Z7901 Long term (current) use of anticoagulants: Secondary | ICD-10-CM | POA: Diagnosis not present

## 2021-08-22 DIAGNOSIS — Z952 Presence of prosthetic heart valve: Secondary | ICD-10-CM

## 2021-08-22 LAB — POCT INR: INR: 4.2 — AB (ref 2.0–3.0)

## 2021-08-22 NOTE — Patient Instructions (Signed)
Hold warfarin tonight then resume 2 tablets daily except 3 tablets on Saturdays Recheck in 3 weeks

## 2021-08-23 ENCOUNTER — Ambulatory Visit: Payer: Medicare HMO | Admitting: Family Medicine

## 2021-08-24 ENCOUNTER — Encounter: Payer: Self-pay | Admitting: Cardiology

## 2021-08-24 ENCOUNTER — Other Ambulatory Visit (HOSPITAL_COMMUNITY)
Admission: RE | Admit: 2021-08-24 | Discharge: 2021-08-24 | Disposition: A | Discharge status: Home / Self Care | Payer: Medicare HMO | Source: Ambulatory Visit | Attending: Cardiology | Admitting: Cardiology

## 2021-08-24 ENCOUNTER — Ambulatory Visit (INDEPENDENT_AMBULATORY_CARE_PROVIDER_SITE_OTHER): Payer: Medicare HMO | Admitting: Cardiology

## 2021-08-24 ENCOUNTER — Other Ambulatory Visit: Payer: Self-pay

## 2021-08-24 VITALS — BP 124/84 | HR 60 | Ht 73.0 in | Wt 255.6 lb

## 2021-08-24 DIAGNOSIS — I429 Cardiomyopathy, unspecified: Secondary | ICD-10-CM

## 2021-08-24 DIAGNOSIS — Z952 Presence of prosthetic heart valve: Secondary | ICD-10-CM

## 2021-08-24 DIAGNOSIS — E7800 Pure hypercholesterolemia, unspecified: Secondary | ICD-10-CM | POA: Diagnosis present

## 2021-08-24 DIAGNOSIS — I10 Essential (primary) hypertension: Secondary | ICD-10-CM

## 2021-08-24 DIAGNOSIS — I428 Other cardiomyopathies: Secondary | ICD-10-CM | POA: Insufficient documentation

## 2021-08-24 LAB — COMPREHENSIVE METABOLIC PANEL
ALT: 9 U/L (ref 0–44)
AST: 14 U/L — ABNORMAL LOW (ref 15–41)
Albumin: 3.9 g/dL (ref 3.5–5.0)
Alkaline Phosphatase: 78 U/L (ref 38–126)
Anion gap: 6 (ref 5–15)
BUN: 27 mg/dL — ABNORMAL HIGH (ref 8–23)
CO2: 19 mmol/L — ABNORMAL LOW (ref 22–32)
Calcium: 8.7 mg/dL — ABNORMAL LOW (ref 8.9–10.3)
Chloride: 116 mmol/L — ABNORMAL HIGH (ref 98–111)
Creatinine, Ser: 1.29 mg/dL — ABNORMAL HIGH (ref 0.61–1.24)
GFR, Estimated: >60 mL/min (ref >60)
Glucose, Bld: 91 mg/dL (ref 70–99)
Potassium: 3.8 mmol/L (ref 3.5–5.1)
Sodium: 141 mmol/L (ref 135–145)
Total Bilirubin: 0.8 mg/dL (ref 0.3–1.2)
Total Protein: 7.5 g/dL (ref 6.5–8.1)

## 2021-08-24 LAB — CBC
HCT: 39.4 % (ref 39.0–52.0)
Hemoglobin: 12.8 g/dL — ABNORMAL LOW (ref 13.0–17.0)
MCH: 25.9 pg — ABNORMAL LOW (ref 26.0–34.0)
MCHC: 32.5 g/dL (ref 30.0–36.0)
MCV: 79.8 fL — ABNORMAL LOW (ref 80.0–100.0)
Platelets: 204 10*3/uL (ref 150–400)
RBC: 4.94 MIL/uL (ref 4.22–5.81)
RDW: 16.1 % — ABNORMAL HIGH (ref 11.5–15.5)
WBC: 5.9 10*3/uL (ref 4.0–10.5)
nRBC: 0 % (ref 0.0–0.2)

## 2021-08-24 MED ORDER — CARVEDILOL 12.5 MG PO TABS: 12.5000 mg | ORAL_TABLET | Freq: Two times a day (BID) | ORAL | 3 refills | Status: DC

## 2021-08-24 MED ORDER — AMLODIPINE BESYLATE 10 MG PO TABS: 10.0000 mg | ORAL_TABLET | Freq: Every day | ORAL | 3 refills | Status: DC

## 2021-08-24 MED ORDER — ASPIRIN EC 81 MG PO TBEC: 81.0000 mg | DELAYED_RELEASE_TABLET | Freq: Every day | ORAL | 3 refills | Status: DC

## 2021-08-24 MED ORDER — RAMIPRIL 10 MG PO CAPS: 10.0000 mg | ORAL_CAPSULE | Freq: Every day | ORAL | 3 refills | Status: DC

## 2021-08-24 MED ORDER — SIMVASTATIN 40 MG PO TABS: 40.0000 mg | ORAL_TABLET | Freq: Every day | ORAL | 3 refills | Status: DC

## 2021-08-24 NOTE — Addendum Note (Signed)
Addended by: Theresia Majors on: 08/24/2021 02:47 PM   Modules accepted: Orders

## 2021-08-24 NOTE — Progress Notes (Signed)
Cardiology Office Note  Date: 08/24/2021   ID: [name], DOB 1951-09-28, MRN [id number]  PCP:  Patient, No Pcp Per (Inactive)  Cardiologist:  None Electrophysiologist:  None   Chief Complaint: status post AVR  History of Present Illness: [name] is a 70 y.o. male with a history of AVR, HTN, HLD, previous cardiac catheterization: Normal coronary arteries. 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  He is here today for followup and is doing well.  He denies any chest pain or pressure, SOB, DOE(except with exercise), PND, orthopnea, LE edema, dizziness, palpitations or syncope. He is compliant with his meds and is tolerating meds with no SE.     Past Medical History:  Diagnosis Date   Aortic insufficiency 2003   S/P St Jude mechanicl AVR on chronic anticoagulation with warfarin   CAD (coronary artery disease), native coronary artery 2003   20% pLADm at cath   HLD (hyperlipidemia)    Hypertension    NICM (nonischemic cardiomyopathy) (HCC)    EF 20-25% at time of AVR and improved to 45% by echo 2017   Poor historian    Sexual dysfunction     Past Surgical History:  Procedure Laterality Date   CIRCUMCISION  2006   [place]--which was uncomplicated   CORONARY ANGIOPLASTY WITH STENT PLACEMENT     EXPLORATION POST OPERATIVE OPEN HEART     open heart surgery    Current Outpatient Medications  Medication Sig Dispense Refill   amLODipine (NORVASC) 10 MG tablet Take 1 tablet (10 mg total) by mouth daily. 90 tablet 1   carvedilol (COREG) 25 MG tablet Take 1 tablet (25 mg total) by mouth daily. 90 tablet 1   HYDROcodone-acetaminophen (NORCO/VICODIN) 5-325 MG tablet Take 1 tablet by mouth every 6 (six) hours as needed for severe pain. 6 tablet 0   ramipril (ALTACE) 10 MG capsule TAKE 1 CAPSULE EVERY DAY 90 capsule 3    simvastatin (ZOCOR) 40 MG tablet Take 1 tablet (40 mg total) by mouth at bedtime. 30 tablet 3   tamsulosin (FLOMAX) 0.4 MG CAPS capsule TAKE 1 CAPSULE AT BEDTIME NEED MD APPOINTMENT FOR REFILLS 90 capsule 1   triamterene-hydrochlorothiazide (MAXZIDE-25) 37.5-25 MG tablet TAKE 1 TABLET BY MOUTH THREE TIMES WEEKLY (NEED MD APPOINTMENT FOR REFILLS) 40 tablet 2   warfarin (COUMADIN) 5 MG tablet TAKE 2 TABLETS DAILY EXCEPT 3 TABLETS ON WEDNESDAYS AND SATURDAYS OR AS DIRECTED 200 tablet 0   No current facility-administered medications for this visit.   Allergies:  Iohexol, Aspirin, and Contrast media [iodinated diagnostic agents]   Social History: The patient  reports that he has never smoked. He has never used smokeless tobacco. He reports current drug use. Drug: Marijuana. He reports that he does not drink alcohol.   Family History: The patient's family history includes Alcoholism in his father; Diabetes in his mother; Hypertension in his mother.   ROS:  Please see the history of present illness. Otherwise, complete review of systems is positive for none.  All other systems are reviewed and negative.   Physical Exam: VS:  BP 124/84 (BP Location: Right Arm, Patient Position: Sitting, Cuff Size: Normal)   Pulse 60   Ht 6\' 1"  (1.854 m)   Wt 255 lb 9.6 oz (115.9 kg)   SpO2 97%   BMI 33.72 kg/m , BMI Body mass  index is 33.72 kg/m.  Wt Readings from Last 3 Encounters:  08/24/21 255 lb 9.6 oz (115.9 kg)  09/07/20 240 lb 3.2 oz (109 kg)  05/13/19 260 lb (117.9 kg)    GEN: Well nourished, well developed in no acute distress HEENT: Normal NECK: No JVD; No carotid bruits LYMPHATICS: No lymphadenopathy CARDIAC:RRR, no murmurs, rubs, gallops.  Crisp mechanical heart sounds RESPIRATORY:  Clear to auscultation without rales, wheezing or rhonchi  ABDOMEN: Soft, non-tender, non-distended MUSCULOSKELETAL:  No edema; No deformity  SKIN: Warm and dry NEUROLOGIC:  Alert and oriented x 3 PSYCHIATRIC:   Normal affect    ECG: EKG was performed in the office today and showed NSR with PVCs with LVH and QRS widening with repol abnormality  Recent Labwork: No results found for requested labs within last 8760 hours.     Component Value Date/Time   CHOL 180 04/29/2018 1032   TRIG 67 04/29/2018 1032   HDL 72 04/29/2018 1032   CHOLHDL 2.5 04/29/2018 1032   VLDL 13 04/29/2018 1032   LDLCALC 95 04/29/2018 1032    Other Studies Reviewed Today:  Most recent echocardiogram performed on 07/16/2016 demonstrated mildly reduced left ventricular systolic function, LVEF 45 to 50%, moderate LVH, normal regional wall motion, normally functioning tilting-disc aortic valve prosthesis, moderate biatrial dilatation, and mildly dilated right ventricle   Assessment and Plan:  1. Aortic valve replaced   2. Primary hypertension   3. Pure hypercholesterolemia   4. Cardiomyopathy, unspecified type (HCC)     1. Severe AR -s/p mechanical AVR -2D echo 2017 showed stable AVR with mean AVG -he denies any bleeding issues on Coumadin -continue Coumadin followed in Coumadin clinic -I instructed him to also take a baby ASA 81mg  daily given his mechanical valve -check CBC -repeat echo since it has been 5 years  2. Essential hypertension -BP is adequately controlled on exam today -Continue prescription drug management with Ramipril 10mg  daily, Amlodipine 10mg  daily and Maxide 37.5-25mg  3 times weekly >refilled -discussed with him that Carvedilol should be twice daily >>his HR is 60bpm so I will change it to 12.5mg  BID -check BMET  3. Mixed hyperlipidemia -LDL goal < 100 -check FLP and ALT -continue prescription drug management with simvastatin 40mg  daily>refilled  4. Cardiomyopathy, unspecified type (HCC) -2D echo 2017 with EF 45% -he does not appear volume overloaded on exam today -change Carvedilol to 12.5mg  BID.  Continue Ramipril 10mg  daily and Maxide. -repeat echo to see if LVF has improved -  if EF still down then change Ramipril to Entresto   Medication Adjustments/Labs and Tests Ordered: Current medicines are reviewed at length with the patient today.  Concerns regarding medicines are outlined above.   Disposition: Follow-up with Dr. or APP 1 year  Signed, , NP 08/24/2021 2:34 PM    [facility] at [facility] [address] [place], [place], [facility] Phone: [phone]; Fax: [phone]

## 2021-08-24 NOTE — Patient Instructions (Signed)
Medication Instructions:  Your physician has recommended you make the following change in your medication: 1) START taking Asprin 81 mg daily 2) CHANGE carvedilol to 12.5 mg twice daily  *If you need a refill on your cardiac medications before your next appointment, please call your pharmacy*   Lab Work: CMET and CBC  If you have labs (blood work) drawn today and your tests are completely normal, you will receive your results only by: MyChart Message (if you have MyChart) OR A paper copy in the mail If you have any lab test that is abnormal or we need to change your treatment, we will call you to review the results.   Testing/Procedures: Your physician has requested that you have an echocardiogram. Echocardiography is a painless test that uses sound waves to create images of your heart. It provides your doctor with information about the size and shape of your heart and how well your heart's chambers and valves are working. This procedure takes approximately one hour. There are no restrictions for this procedure.  Follow-Up: At [facility], you and your health needs are our priority.  As part of our continuing mission to provide you with exceptional heart care, we have created designated Provider Care Teams.  These Care Teams include your primary Cardiologist (physician) and Advanced Practice Providers (APPs -  Physician Assistants and Nurse Practitioners) who all work together to provide you with the care you need, when you need it.  Your next appointment:   1 year(s)  The format for your next appointment:   In Person  Provider:   You may see Armanda Magic, MD or one of the following Advanced Practice Providers on your designated Care Team:   [place], PA-C  Jacolyn Reedy, [place]

## 2021-08-30 ENCOUNTER — Telehealth: Payer: Self-pay | Admitting: *Deleted

## 2021-08-30 DIAGNOSIS — Z79899 Other long term (current) drug therapy: Secondary | ICD-10-CM

## 2021-08-30 NOTE — Telephone Encounter (Signed)
Pt agrees with plan of care to stop Maxzide and have lab work done and one week. Pt will call for weight gain.

## 2021-09-15 ENCOUNTER — Ambulatory Visit (INDEPENDENT_AMBULATORY_CARE_PROVIDER_SITE_OTHER): Payer: Medicare HMO | Admitting: *Deleted

## 2021-09-15 ENCOUNTER — Other Ambulatory Visit: Payer: Self-pay

## 2021-09-15 DIAGNOSIS — Z952 Presence of prosthetic heart valve: Secondary | ICD-10-CM | POA: Diagnosis not present

## 2021-09-15 DIAGNOSIS — Z7901 Long term (current) use of anticoagulants: Secondary | ICD-10-CM | POA: Diagnosis not present

## 2021-09-15 DIAGNOSIS — Z5181 Encounter for therapeutic drug level monitoring: Secondary | ICD-10-CM | POA: Diagnosis not present

## 2021-09-15 LAB — POCT INR: INR: 5.5 — AB (ref 2.0–3.0)

## 2021-09-15 NOTE — Patient Instructions (Signed)
Description   -Hold warfarin today and tomorrow -Then START taking warfarin 2 tablets daily.  -Recheck INR in 2 weeks.

## 2021-09-19 ENCOUNTER — Other Ambulatory Visit: Payer: Self-pay | Admitting: Family Medicine

## 2021-09-20 ENCOUNTER — Other Ambulatory Visit: Payer: Self-pay

## 2021-09-20 MED ORDER — WARFARIN SODIUM 5 MG PO TABS: ORAL_TABLET | ORAL | 1 refills | Status: DC

## 2021-09-20 NOTE — Telephone Encounter (Signed)
Pt's Coumadin was sent to [facility]. He called and ask that his medication be sent to [facility]. Please handle. Thanks

## 2021-09-27 ENCOUNTER — Other Ambulatory Visit: Payer: Self-pay

## 2021-09-27 ENCOUNTER — Ambulatory Visit (HOSPITAL_COMMUNITY)
Admission: RE | Admit: 2021-09-27 | Discharge: 2021-09-27 | Disposition: A | Discharge status: Home / Self Care | Payer: Medicare HMO | Source: Ambulatory Visit | Attending: Cardiology | Admitting: Cardiology

## 2021-09-27 DIAGNOSIS — Z952 Presence of prosthetic heart valve: Secondary | ICD-10-CM | POA: Insufficient documentation

## 2021-09-27 DIAGNOSIS — I429 Cardiomyopathy, unspecified: Secondary | ICD-10-CM | POA: Diagnosis present

## 2021-09-27 LAB — ECHOCARDIOGRAM COMPLETE
AV Mean grad: 15.5 mmHg
AV Peak grad: 32.8 mmHg
Ao pk vel: 2.87 m/s
Area-P 1/2: 2.56 cm2
S' Lateral: 5.2 cm

## 2021-09-27 NOTE — Progress Notes (Signed)
*  PRELIMINARY RESULTS* Echocardiogram 2D Echocardiogram has been performed.  [name] 09/27/2021, 12:28 PM

## 2021-09-28 ENCOUNTER — Encounter: Payer: Self-pay | Admitting: Cardiology

## 2021-09-29 ENCOUNTER — Telehealth: Payer: Self-pay

## 2021-09-29 ENCOUNTER — Other Ambulatory Visit: Payer: Self-pay

## 2021-09-29 ENCOUNTER — Ambulatory Visit (INDEPENDENT_AMBULATORY_CARE_PROVIDER_SITE_OTHER): Payer: Medicare HMO | Admitting: *Deleted

## 2021-09-29 DIAGNOSIS — Z7901 Long term (current) use of anticoagulants: Secondary | ICD-10-CM

## 2021-09-29 DIAGNOSIS — Z952 Presence of prosthetic heart valve: Secondary | ICD-10-CM | POA: Diagnosis not present

## 2021-09-29 DIAGNOSIS — Z5181 Encounter for therapeutic drug level monitoring: Secondary | ICD-10-CM

## 2021-09-29 DIAGNOSIS — R931 Abnormal findings on diagnostic imaging of heart and coronary circulation: Secondary | ICD-10-CM

## 2021-09-29 LAB — POCT INR: INR: 2.1 (ref 2.0–3.0)

## 2021-09-29 NOTE — Telephone Encounter (Signed)
Order entered for lexiscan,left message for patient to call back.

## 2021-09-29 NOTE — Patient Instructions (Signed)
Missed 3-4 days warfarin---Ran out Continue warfarin 2 tablets daily.  -Recheck INR in 3 weeks.

## 2021-09-29 NOTE — Telephone Encounter (Signed)
[name] Reichert, MD  09/28/2021 11:56 AM EST Back to Top    Echo shows moderately reduced systolic function with EF 30 to 35% with global hypokinesis and severe LVH.  There is severe biatrial enlargement and mildly leaky mitral valve.  The St Jude mechanical AVR is stable with a mean aortic valve gradient 15 mmHg with mild AR.  Compared to 2D echo of 2017, the left atrium and right atrium are now severely enlarged instead of moderate.  LV function has declined from 45 to 50% now down to 30 to 35%.  The mean aortic valve gradient of the AVR is stable at 15 mmHg (prior 21 mmHg in 2017).    Please get a Lexiscan Myoview to rule out ischemia given decline in LV function.   Shared Decision Making/Informed Consent The risks [chest pain, shortness of breath, cardiac arrhythmias, dizziness, blood pressure fluctuations, myocardial infarction, stroke/transient ischemic attack, nausea, vomiting, allergic reaction, radiation exposure, metallic taste sensation and life-threatening complications (estimated to be 1 in 10,000)], benefits (risk stratification, diagnosing coronary artery disease, treatment guidance) and alternatives of a nuclear stress test were discussed in detail with [name] and he agrees to proceed.

## 2021-10-03 ENCOUNTER — Telehealth: Payer: Self-pay | Admitting: *Deleted

## 2021-10-03 NOTE — Telephone Encounter (Signed)
Pt notified of the need to have stress test ( Lexi Scan) done.

## 2021-10-04 NOTE — Telephone Encounter (Signed)
Pt scheduled for 11.21.22

## 2021-10-10 ENCOUNTER — Ambulatory Visit (HOSPITAL_COMMUNITY)
Admission: RE | Admit: 2021-10-10 | Discharge: 2021-10-10 | Disposition: A | Discharge status: Home / Self Care | Payer: Medicare HMO | Source: Ambulatory Visit | Attending: Cardiology | Admitting: Cardiology

## 2021-10-10 ENCOUNTER — Other Ambulatory Visit: Payer: Self-pay

## 2021-10-10 ENCOUNTER — Encounter (HOSPITAL_COMMUNITY)
Admission: RE | Admit: 2021-10-10 | Discharge: 2021-10-10 | Disposition: A | Discharge status: Home / Self Care | Payer: Medicare HMO | Source: Ambulatory Visit | Attending: Cardiology | Admitting: Cardiology

## 2021-10-10 ENCOUNTER — Encounter (HOSPITAL_COMMUNITY): Payer: Self-pay

## 2021-10-10 DIAGNOSIS — I501 Left ventricular failure: Secondary | ICD-10-CM | POA: Diagnosis not present

## 2021-10-10 DIAGNOSIS — Z8679 Personal history of other diseases of the circulatory system: Secondary | ICD-10-CM

## 2021-10-10 DIAGNOSIS — R931 Abnormal findings on diagnostic imaging of heart and coronary circulation: Secondary | ICD-10-CM | POA: Insufficient documentation

## 2021-10-10 LAB — NM MYOCAR MULTI W/SPECT W/WALL MOTION / EF
Estimated workload: 1
Exercise duration (min): 0 min
Exercise duration (sec): 0 s
LV dias vol: 321 mL (ref 62–150)
LV sys vol: 240 mL
MPHR: 152 {beats}/min
Nuc Stress EF: 25 %
Peak HR: 85 {beats}/min
Percent HR: 55 %
RATE: 0.3
Rest HR: 57 {beats}/min
Rest Nuclear Isotope Dose: 8.3 mCi
SDS: 1
SRS: 7
SSS: 8
Stress Nuclear Isotope Dose: 28.8 mCi
TID: 1.01

## 2021-10-10 MED ORDER — SODIUM CHLORIDE FLUSH 0.9 % IV SOLN
INTRAVENOUS | Status: AC
  Administered 2021-10-10: 10 mL via INTRAVENOUS
  Filled 2021-10-10: qty 10

## 2021-10-10 MED ORDER — TECHNETIUM TC 99M TETROFOSMIN IV KIT
21.0000 | PACK | Freq: Once | INTRAVENOUS | Status: AC | PRN
  Administered 2021-10-10: 28.8 via INTRAVENOUS

## 2021-10-10 MED ORDER — TECHNETIUM TC 99M TETROFOSMIN IV KIT
7.0000 | PACK | Freq: Once | INTRAVENOUS | Status: AC | PRN
  Administered 2021-10-10: 8.3 via INTRAVENOUS

## 2021-10-10 MED ORDER — REGADENOSON 0.4 MG/5ML IV SOLN
INTRAVENOUS | Status: AC
  Administered 2021-10-10: 0.4 mg via INTRAVENOUS
  Filled 2021-10-10: qty 5

## 2021-10-11 ENCOUNTER — Other Ambulatory Visit: Payer: Self-pay | Admitting: Cardiology

## 2021-10-17 ENCOUNTER — Telehealth: Payer: Self-pay

## 2021-10-17 MED ORDER — ENTRESTO 24-26 MG PO TABS: 1.0000 | ORAL_TABLET | Freq: Two times a day (BID) | ORAL | 6 refills | Status: DC

## 2021-10-17 MED ORDER — SPIRONOLACTONE 25 MG PO TABS: 12.5000 mg | ORAL_TABLET | Freq: Every day | ORAL | 3 refills | Status: DC

## 2021-10-17 NOTE — Telephone Encounter (Signed)
[name] Reichert, MD  10/11/2021 11:11 PM EST Back to Top    Nuclear stress test with no ischemia but LV dysfunction similar to echo.  Nonischemic DCM.  Please stop Altace.  Start Entresto 24-26mg  BID 48 hours after stopping Altace.  Continue Carvedilol.  Add spironolactone 12.5mg  daily.  Followup with PA in 2 weeks for uptitration of Entresto.  May need to cut back on amlodipine to titrate Entresto to max dose.  Continue bimonthly followup with PA until Entresto at max tolerate dose and then repeat 2D echo 8 weeks later.  Patient to followup with me in office in 8 weeks if appt available.         I spoke with [name] and discussed nuclear stress test.He agrees to stop ramipril and start entresto 24/26 mg twice a day in 48 hours. He will also start spironolactone 12.5 mg daily. Follow up on 12/22 at 1:30 pm with B.Strader, PA-C   We sent rx's to [facility] as his other mail order pharmacy would take at least a week to arrive he says.30 day Free trial of Entresto sent to [facility]

## 2021-10-20 ENCOUNTER — Ambulatory Visit (INDEPENDENT_AMBULATORY_CARE_PROVIDER_SITE_OTHER): Payer: Medicare HMO | Admitting: *Deleted

## 2021-10-20 DIAGNOSIS — Z7901 Long term (current) use of anticoagulants: Secondary | ICD-10-CM | POA: Diagnosis not present

## 2021-10-20 DIAGNOSIS — Z952 Presence of prosthetic heart valve: Secondary | ICD-10-CM

## 2021-10-20 DIAGNOSIS — Z5181 Encounter for therapeutic drug level monitoring: Secondary | ICD-10-CM

## 2021-10-20 LAB — POCT INR: INR: 2 (ref 2.0–3.0)

## 2021-10-20 NOTE — Patient Instructions (Signed)
Increase warfarin to 2 tablets daily except 3 tablets on Thursday -Recheck INR in 3 weeks.

## 2021-11-09 NOTE — Progress Notes (Signed)
Cardiology Office Note    Date:  11/10/2021   ID:  [name], DOB May 31, 1951, MRN [id number]  PCP:  Default, Provider, MD  Cardiologist: Armanda Magic, MD    Chief Complaint  Patient presents with   Follow-up    Review test results    History of Present Illness:    [name] is a 70 y.o. male with past medical history of severe AI (s/p St. Jude mechanical AVR in 2003 with Bentall procedure), normal cors in 2003, HTN, HLD and HFrEF (EF 45-50% in 06/2016, at 30-35% in 09/2021) who presents to the office today for follow-up of his recent echocardiogram and stress test.  He was last examined by Dr. Mayford Knife in 08/2021 and reported overall doing well at that time and denied any chest pain or progressive dyspnea on exertion. Given the timeframe since his last echocardiogram, a repeat study was obtained and showed his EF was further reduced at 30 to 35% with global hypokinesis and grade 1 diastolic dysfunction. He also had severe LVH, severe biatrial dilation and mild MR. His aortic valve prosthesis was functioning normally with only mild AI.  His stress test showed his reduced EF but no evidence of prior infarct or current ischemia, suggesting a nonischemic cardiomyopathy. Altace was discontinued and he was started on Entresto 24-26 mg twice daily along with being continued on Carvedilol. Spironolactone 12.5 mg daily was also added to his medication regimen. Close follow-up was arranged to allow for further titration of his medications.  In talking with the patient today, he reports overall doing well from a cardiac perspective since his last office visit. He has been trying to do more exercises at home based off different exercise programs that come on his television and denies any recent chest pain or dyspnea on exertion with this. No recent orthopnea, PND, pitting edema or palpitations.  He reports good compliance with his current medication regimen. No reported side effects since  starting Entresto or Spironolactone.  Past Medical History:  Diagnosis Date   Aortic insufficiency 2003   S/P St Jude mechanicl AVR on chronic anticoagulation with warfarin   CAD (coronary artery disease), native coronary artery 2003   20% pLADm at cath   HLD (hyperlipidemia)    Hypertension    NICM (nonischemic cardiomyopathy) (HCC)    EF 20-25% at time of AVR and improved to 45% by echo 2017 and now  30-35% by echo 09/2021   Poor historian    Sexual dysfunction     Past Surgical History:  Procedure Laterality Date   CIRCUMCISION  2006   [place]--which was uncomplicated   CORONARY ANGIOPLASTY WITH STENT PLACEMENT     EXPLORATION POST OPERATIVE OPEN HEART     open heart surgery    Current Medications: Outpatient Medications Prior to Visit  Medication Sig Dispense Refill   amLODipine (NORVASC) 10 MG tablet Take 1 tablet (10 mg total) by mouth daily. 90 tablet 3   aspirin EC 81 MG tablet Take 1 tablet (81 mg total) by mouth daily. Swallow whole. 90 tablet 3   carvedilol (COREG) 12.5 MG tablet Take 1 tablet (12.5 mg total) by mouth 2 (two) times daily. 180 tablet 3   HYDROcodone-acetaminophen (NORCO/VICODIN) 5-325 MG tablet Take 1 tablet by mouth every 6 (six) hours as needed for severe pain. 6 tablet 0   sacubitril-valsartan (ENTRESTO) 24-26 MG Take 1 tablet by mouth 2 (two) times daily. 60 tablet 6   simvastatin (ZOCOR) 40 MG tablet Take 1 tablet (  40 mg total) by mouth at bedtime. 90 tablet 3   spironolactone (ALDACTONE) 25 MG tablet Take 0.5 tablets (12.5 mg total) by mouth daily. 45 tablet 3   tamsulosin (FLOMAX) 0.4 MG CAPS capsule TAKE 1 CAPSULE AT BEDTIME NEED MD APPOINTMENT FOR REFILLS 90 capsule 1   warfarin (COUMADIN) 5 MG tablet Take 2 tablets daily or as directed 180 tablet 1   No facility-administered medications prior to visit.     Allergies:   Iohexol, Aspirin, and Contrast media [iodinated diagnostic agents]   Social History   Socioeconomic History    Marital status: Divorced    Spouse name: Not on file   Number of children: Not on file   Years of education: Not on file   Highest education level: Not on file  Occupational History   Not on file  Tobacco Use   Smoking status: Never   Smokeless tobacco: Never  Vaping Use   Vaping Use: Never used  Substance and Sexual Activity   Alcohol use: No   Drug use: Yes    Types: Marijuana    Comment: last use 1 month ago   Sexual activity: Yes    Birth control/protection: None  Other Topics Concern   Not on file  Social History Narrative   Not on file   Social Determinants of Health   Financial Resource Strain: Not on file  Food Insecurity: Not on file  Transportation Needs: Not on file  Physical Activity: Not on file  Stress: Not on file  Social Connections: Not on file     Family History:  The patient's family history includes Alcoholism in his father; Diabetes in his mother; Hypertension in his mother.   Review of Systems:    Please see the history of present illness.     All other systems reviewed and are otherwise negative except as noted above.   Physical Exam:    VS:  BP 136/78    Pulse 68    Ht 6\' 1"  (1.854 m)    Wt 244 lb (110.7 kg)    SpO2 98%    BMI 32.19 kg/m    General: Well developed, well nourished,male appearing in no acute distress. Head: Normocephalic, atraumatic. Neck: No carotid bruits. JVD not elevated.  Lungs: Respirations regular and unlabored, without wheezes or rales.  Heart: Regular rate and rhythm. No S3 or S4. Crisp mechanical valve sounds. Abdomen: Appears non-distended. No obvious abdominal masses. Msk:  Strength and tone appear normal for age. No obvious joint deformities or effusions. Extremities: No clubbing or cyanosis. No pitting edema.  Distal pedal pulses are 2+ bilaterally. Neuro: Alert and oriented X 3. Moves all extremities spontaneously. No focal deficits noted. Psych:  Responds to questions appropriately with a normal  affect. Skin: No rashes or lesions noted  Wt Readings from Last 3 Encounters:  11/10/21 244 lb (110.7 kg)  08/24/21 255 lb 9.6 oz (115.9 kg)  09/07/20 240 lb 3.2 oz (109 kg)     Studies/Labs Reviewed:   EKG:  EKG is not ordered today.   Recent Labs: 08/24/2021: ALT 9; BUN 27; Creatinine, Ser 1.29; Hemoglobin 12.8; Platelets 204; Potassium 3.8; Sodium 141   Lipid Panel    Component Value Date/Time   CHOL 180 04/29/2018 1032   TRIG 67 04/29/2018 1032   HDL 72 04/29/2018 1032   CHOLHDL 2.5 04/29/2018 1032   VLDL 13 04/29/2018 1032   LDLCALC 95 04/29/2018 1032    Additional studies/ records that were reviewed today  include:   Echocardiogram: 09/2021 IMPRESSIONS     1. Left ventricular ejection fraction, by estimation, is 30 to 35%. The  left ventricle has moderately decreased function. The left ventricle  demonstrates global hypokinesis. The left ventricular internal cavity size  was mildly dilated. There is severe  left ventricular hypertrophy. Left ventricular diastolic parameters are  consistent with Grade I diastolic dysfunction (impaired relaxation).   2. Right ventricular systolic function is normal. The right ventricular  size is normal. Tricuspid regurgitation signal is inadequate for assessing  PA pressure.   3. Left atrial size was severely dilated.   4. Right atrial size was severely dilated.   5. The mitral valve is abnormal. Mild mitral valve regurgitation. No  evidence of mitral stenosis.   6. St Jude mechanical valve is in the AV position, unknown size. Mild  mean gradient across the valve of 15 mmhg. . The aortic valve has been  repaired/replaced. Aortic valve regurgitation is mild.   7. The inferior vena cava is normal in size with greater than 50%  respiratory variability, suggesting right atrial pressure of 3 mmHg.    NST: 09/2021 The study is high risk. Risk is based on severe LV dysfunction. There is no prior infarct or current ischemia. Recommed  correlating LVEF with echo.   Left ventricular function is abnormal. Nuclear stress EF: 25 %. The left ventricular ejection fraction is severely decreased (<30%). End diastolic cavity size is severely enlarged.   There is a large moderate intensity inferior defect that is most intense in the resting images. There is overall global hypokinesis that is not more pronounced in the inferior wall. Overall findings most consistent with diaphragmatic attenuation as opposed to inferior scar.   Assessment:    1. Chronic HFrEF (heart failure with reduced ejection fraction) (HCC)   2. Aortic valve replaced   3. Chronic anticoagulation   4. Medication management   5. Essential hypertension   6. Mixed hyperlipidemia      Plan:   In order of problems listed above:  1. HFrEF - His EF was previously at 45 to 50% in 06/2016 but further reduced at 30 to 35% when rechecked in 09/2021 and stress test at that time showed no evidence of prior infarct or current ischemia, overall consistent with NICM.  - He appears euvolemic on examination today and denies any recent dyspnea, orthopnea, PND or lower extremity edema. - He is currently on Coreg 12.5 mg twice daily, Entresto 24-26 mg twice daily and Spironolactone 12.5 mg daily. He has not had follow-up labs since initiation of Entresto and Spironolactone, therefore will obtain a repeat BMET today. If renal function and electrolytes remain stable, will plan to titrate Entresto to 49-51mg  BID. If BP starts to become too soft, can reduce or eventually discontinue Amlodipine.   2. Severe AI/Use of Chronic Anticoagulation - He is s/p St. Jude mechanical AVR in 2003 with Bentall procedure. His valve was functioning normally by recent echocardiogram last month with only mild aortic regurgitation noted. He remains on Coumadin for anticoagulation and this is followed by our [facility] in [place]. INR was elevated at 5.1 today with adjustments made in his regimen.   3.  HTN - His BP is well-controlled at 136/78 during today's visit. He is currently on Amlodipine 10 mg daily, Coreg 12.5 mg twice daily, Entresto 24-26 mg twice daily and Spironolactone 12.5 mg daily. Will plan to titrate Entresto as outlined above pending reassessment of his renal function and electrolytes.  4. HLD - Followed by his PCP. He remains on Simvastatin 40 mg daily.   Medication Adjustments/Labs and Tests Ordered: Current medicines are reviewed at length with the patient today.  Concerns regarding medicines are outlined above.  Medication changes, Labs and Tests ordered today are listed in the Patient Instructions below. Patient Instructions  Medication Instructions:  Your physician recommends that you continue on your current medications as directed. Please refer to the Current Medication list given to you today.   Labwork: BMET today   Testing/Procedures: None   Follow-Up: 4-6 weeks   Any Other Special Instructions Will Be Listed Below (If Applicable).  If you need a refill on your cardiac medications before your next appointment, please call your pharmacy.    Signed, Ellsworth Lennox, PA-C  11/10/2021 2:01 PM    [facility] [address] [place], [place] [address] Phone: [phone] Fax: [phone]

## 2021-11-10 ENCOUNTER — Other Ambulatory Visit (HOSPITAL_COMMUNITY)
Admission: RE | Admit: 2021-11-10 | Discharge: 2021-11-10 | Disposition: A | Discharge status: Home / Self Care | Payer: Medicare HMO | Source: Ambulatory Visit | Attending: Student | Admitting: Student

## 2021-11-10 ENCOUNTER — Other Ambulatory Visit: Payer: Self-pay

## 2021-11-10 ENCOUNTER — Ambulatory Visit (INDEPENDENT_AMBULATORY_CARE_PROVIDER_SITE_OTHER): Payer: Medicare HMO | Admitting: *Deleted

## 2021-11-10 ENCOUNTER — Ambulatory Visit (INDEPENDENT_AMBULATORY_CARE_PROVIDER_SITE_OTHER): Payer: Medicare HMO | Admitting: Student

## 2021-11-10 ENCOUNTER — Encounter: Payer: Self-pay | Admitting: Student

## 2021-11-10 VITALS — BP 136/78 | HR 68 | Ht 73.0 in | Wt 244.0 lb

## 2021-11-10 DIAGNOSIS — Z5181 Encounter for therapeutic drug level monitoring: Secondary | ICD-10-CM | POA: Diagnosis not present

## 2021-11-10 DIAGNOSIS — E782 Mixed hyperlipidemia: Secondary | ICD-10-CM

## 2021-11-10 DIAGNOSIS — I5022 Chronic systolic (congestive) heart failure: Secondary | ICD-10-CM

## 2021-11-10 DIAGNOSIS — Z7901 Long term (current) use of anticoagulants: Secondary | ICD-10-CM | POA: Diagnosis not present

## 2021-11-10 DIAGNOSIS — Z952 Presence of prosthetic heart valve: Secondary | ICD-10-CM | POA: Diagnosis not present

## 2021-11-10 DIAGNOSIS — I10 Essential (primary) hypertension: Secondary | ICD-10-CM

## 2021-11-10 DIAGNOSIS — Z79899 Other long term (current) drug therapy: Secondary | ICD-10-CM

## 2021-11-10 LAB — BASIC METABOLIC PANEL
Anion gap: 6 (ref 5–15)
BUN: 28 mg/dL — ABNORMAL HIGH (ref 8–23)
CO2: 21 mmol/L — ABNORMAL LOW (ref 22–32)
Calcium: 8.7 mg/dL — ABNORMAL LOW (ref 8.9–10.3)
Chloride: 106 mmol/L (ref 98–111)
Creatinine, Ser: 1.16 mg/dL (ref 0.61–1.24)
GFR, Estimated: >60 mL/min (ref >60)
Glucose, Bld: 116 mg/dL — ABNORMAL HIGH (ref 70–99)
Potassium: 3.8 mmol/L (ref 3.5–5.1)
Sodium: 133 mmol/L — ABNORMAL LOW (ref 135–145)

## 2021-11-10 LAB — POCT INR: INR: 5.1 — AB (ref 2.0–3.0)

## 2021-11-10 NOTE — Patient Instructions (Signed)
Hold warfarin tonight and tomorrow night then resume 2 tablets daily except 3 tablets on Thursday -Recheck INR in 10 days.

## 2021-11-10 NOTE — Patient Instructions (Signed)
Medication Instructions:  Your physician recommends that you continue on your current medications as directed. Please refer to the Current Medication list given to you today.   Labwork: BMET today   Testing/Procedures: None   Follow-Up: 4-6 weeks   Any Other Special Instructions Will Be Listed Below (If Applicable).  If you need a refill on your cardiac medications before your next appointment, please call your pharmacy.

## 2021-11-11 ENCOUNTER — Telehealth: Payer: Self-pay | Admitting: *Deleted

## 2021-11-11 MED ORDER — ENTRESTO 49-51 MG PO TABS: 1.0000 | ORAL_TABLET | Freq: Two times a day (BID) | ORAL | 3 refills | Status: DC

## 2021-11-11 NOTE — Telephone Encounter (Signed)
-----   Message from Ellsworth Lennox, [place] sent at 11/10/2021  2:55 PM EST ----- Please let the patient know his potassium remains within a normal range. Sodium slightly low. Kidney function actually improved when compared to prior values. Would recommend titration of Entresto to 49-51mg  BID. Can double up on his current Entresto tablets until the new Rx arrives.

## 2021-11-11 NOTE — Telephone Encounter (Signed)
Pt notified of test results in the need to increase Entresto to 49/51 mg. Order placed.

## 2021-11-22 ENCOUNTER — Ambulatory Visit (INDEPENDENT_AMBULATORY_CARE_PROVIDER_SITE_OTHER): Payer: Medicare HMO | Admitting: *Deleted

## 2021-11-22 DIAGNOSIS — Z5181 Encounter for therapeutic drug level monitoring: Secondary | ICD-10-CM | POA: Diagnosis not present

## 2021-11-22 DIAGNOSIS — Z952 Presence of prosthetic heart valve: Secondary | ICD-10-CM

## 2021-11-22 LAB — POCT INR: INR: 4.7 — AB (ref 2.0–3.0)

## 2021-11-22 NOTE — Patient Instructions (Signed)
Hold warfarin tonight then decrease dose to 2 tablets daily except 1 tablet on Sundays and Wednesdays -Recheck INR in 2 wks

## 2021-12-06 ENCOUNTER — Ambulatory Visit (INDEPENDENT_AMBULATORY_CARE_PROVIDER_SITE_OTHER): Payer: Medicare HMO | Admitting: *Deleted

## 2021-12-06 DIAGNOSIS — Z952 Presence of prosthetic heart valve: Secondary | ICD-10-CM | POA: Diagnosis not present

## 2021-12-06 DIAGNOSIS — Z5181 Encounter for therapeutic drug level monitoring: Secondary | ICD-10-CM | POA: Diagnosis not present

## 2021-12-06 LAB — POCT INR: INR: 2.7 (ref 2.0–3.0)

## 2021-12-06 NOTE — Patient Instructions (Signed)
Pt states he has been taking 2 tablets daily x 2 wks Continue 2 tablets daily -Recheck INR in 3 wks

## 2021-12-27 ENCOUNTER — Ambulatory Visit (INDEPENDENT_AMBULATORY_CARE_PROVIDER_SITE_OTHER): Payer: Medicare HMO | Admitting: *Deleted

## 2021-12-27 ENCOUNTER — Other Ambulatory Visit: Payer: Self-pay

## 2021-12-27 DIAGNOSIS — Z952 Presence of prosthetic heart valve: Secondary | ICD-10-CM

## 2021-12-27 DIAGNOSIS — Z5181 Encounter for therapeutic drug level monitoring: Secondary | ICD-10-CM

## 2021-12-27 LAB — POCT INR: INR: 3.8 — AB (ref 2.0–3.0)

## 2021-12-27 NOTE — Patient Instructions (Signed)
Take warfarin 1/2 tablet today then resume 2 tablets daily -Recheck INR in 3 wks

## 2021-12-29 NOTE — Progress Notes (Signed)
Cardiology Office Note    Date:  12/30/2021   ID:  [name], DOB June 27, 1951, MRN [id number]  PCP:  Default, Provider, MD  Cardiologist: Armanda Magic, MD    Chief Complaint  Patient presents with   Follow-up    6 week visit    History of Present Illness:    [name] is a 71 y.o. male with past medical history of severe AI (s/p St. Jude mechanical AVR in 2003 with Bentall procedure), normal cors in 2003, HTN, HLD and HFrEF (EF 45-50% in 06/2016, at 30-35% in 09/2021) who presents to the office today for 6-week follow-up  He was last examined by myself in 10/2021 and was performing routine exercises at home and denied any recent anginal symptoms with this. He was tolerating his current medications well and was continued on Coreg, Entresto and Spironolactone with plans for follow-up labs and titration of Entresto if renal function remains stable.  Creatinine had improved to 1.16 by labs on 11/10/2021, therefore Entresto was titrated to 49-51 mg twice daily.  In talking with the patient today, he reports overall feeling well since his last office visit. Says that he is exercising more routinely at home and feels like this is helping. Reports his breathing has been normal and he denies any recent orthopnea, PND or pitting edema. No recent chest pain or palpitations. He reports tolerating his current medications well without any reported side effects.  Past Medical History:  Diagnosis Date   Aortic insufficiency 2003   S/P St Jude mechanicl AVR on chronic anticoagulation with warfarin   CAD (coronary artery disease), native coronary artery 2003   20% pLADm at cath   HLD (hyperlipidemia)    Hypertension    NICM (nonischemic cardiomyopathy) (HCC)    EF 20-25% at time of AVR and improved to 45% by echo 2017 and now  30-35% by echo 09/2021   Poor historian    Sexual dysfunction     Past Surgical History:  Procedure Laterality Date   CIRCUMCISION  2006   [place]--which was  uncomplicated   CORONARY ANGIOPLASTY WITH STENT PLACEMENT     EXPLORATION POST OPERATIVE OPEN HEART     open heart surgery    Current Medications: Outpatient Medications Prior to Visit  Medication Sig Dispense Refill   amLODipine (NORVASC) 10 MG tablet Take 1 tablet (10 mg total) by mouth daily. 90 tablet 3   aspirin EC 81 MG tablet Take 1 tablet (81 mg total) by mouth daily. Swallow whole. 90 tablet 3   carvedilol (COREG) 12.5 MG tablet Take 1 tablet (12.5 mg total) by mouth 2 (two) times daily. 180 tablet 3   HYDROcodone-acetaminophen (NORCO/VICODIN) 5-325 MG tablet Take 1 tablet by mouth every 6 (six) hours as needed for severe pain. 6 tablet 0   sacubitril-valsartan (ENTRESTO) 49-51 MG Take 1 tablet by mouth 2 (two) times daily. 180 tablet 3   simvastatin (ZOCOR) 40 MG tablet Take 1 tablet (40 mg total) by mouth at bedtime. 90 tablet 3   spironolactone (ALDACTONE) 25 MG tablet Take 0.5 tablets (12.5 mg total) by mouth daily. 45 tablet 3   tamsulosin (FLOMAX) 0.4 MG CAPS capsule TAKE 1 CAPSULE AT BEDTIME NEED MD APPOINTMENT FOR REFILLS 90 capsule 1   warfarin (COUMADIN) 5 MG tablet Take 2 tablets daily or as directed 180 tablet 1   No facility-administered medications prior to visit.     Allergies:   Iohexol, Aspirin, and Contrast media [iodinated contrast media]  Social History   Socioeconomic History   Marital status: Divorced    Spouse name: Not on file   Number of children: Not on file   Years of education: Not on file   Highest education level: Not on file  Occupational History   Not on file  Tobacco Use   Smoking status: Some Days    Types: Cigarettes   Smokeless tobacco: Never  Vaping Use   Vaping Use: Never used  Substance and Sexual Activity   Alcohol use: No   Drug use: Yes    Types: Marijuana    Comment: last use 1 month ago   Sexual activity: Yes    Birth control/protection: None  Other Topics Concern   Not on file  Social History Narrative   Not on  file   Social Determinants of Health   Financial Resource Strain: Not on file  Food Insecurity: Not on file  Transportation Needs: Not on file  Physical Activity: Not on file  Stress: Not on file  Social Connections: Not on file     Family History:  The patient's family history includes Alcoholism in his father; Diabetes in his mother; Hypertension in his mother.   Review of Systems:    Please see the history of present illness.     All other systems reviewed and are otherwise negative except as noted above.   Physical Exam:    VS:  BP 132/86    Pulse 64    Ht 6\' 1"  (1.854 m)    Wt 247 lb (112 kg)    SpO2 98%    BMI 32.59 kg/m    General: Well developed, well nourished,male appearing in no acute distress. Head: Normocephalic, atraumatic. Neck: No carotid bruits. JVD not elevated.  Lungs: Respirations regular and unlabored, without wheezes or rales.  Heart: Regular rate and rhythm. Crisp mechanical valve sounds appreciated.  Abdomen: Appears non-distended. No obvious abdominal masses. Msk:  Strength and tone appear normal for age. No obvious joint deformities or effusions. Extremities: No clubbing or cyanosis. No pitting edema.  Distal pedal pulses are 2+ bilaterally. Neuro: Alert and oriented X 3. Moves all extremities spontaneously. No focal deficits noted. Psych:  Responds to questions appropriately with a normal affect. Skin: No rashes or lesions noted  Wt Readings from Last 3 Encounters:  12/30/21 247 lb (112 kg)  11/10/21 244 lb (110.7 kg)  08/24/21 255 lb 9.6 oz (115.9 kg)     Studies/Labs Reviewed:   EKG:  EKG is not ordered today.   Recent Labs: 08/24/2021: ALT 9; Hemoglobin 12.8; Platelets 204 11/10/2021: BUN 28; Creatinine, Ser 1.16; Potassium 3.8; Sodium 133   Lipid Panel    Component Value Date/Time   CHOL 180 04/29/2018 1032   TRIG 67 04/29/2018 1032   HDL 72 04/29/2018 1032   CHOLHDL 2.5 04/29/2018 1032   VLDL 13 04/29/2018 1032   LDLCALC 95  04/29/2018 1032    Additional studies/ records that were reviewed today include:   Echocardiogram: 09/2021 IMPRESSIONS     1. Left ventricular ejection fraction, by estimation, is 30 to 35%. The  left ventricle has moderately decreased function. The left ventricle  demonstrates global hypokinesis. The left ventricular internal cavity size  was mildly dilated. There is severe  left ventricular hypertrophy. Left ventricular diastolic parameters are  consistent with Grade I diastolic dysfunction (impaired relaxation).   2. Right ventricular systolic function is normal. The right ventricular  size is normal. Tricuspid regurgitation signal is inadequate for  assessing  PA pressure.   3. Left atrial size was severely dilated.   4. Right atrial size was severely dilated.   5. The mitral valve is abnormal. Mild mitral valve regurgitation. No  evidence of mitral stenosis.   6. St Jude mechanical valve is in the AV position, unknown size. Mild  mean gradient across the valve of 15 mmhg. . The aortic valve has been  repaired/replaced. Aortic valve regurgitation is mild.   7. The inferior vena cava is normal in size with greater than 50%  respiratory variability, suggesting right atrial pressure of 3 mmHg.   NST: 09/2021 The study is high risk. Risk is based on severe LV dysfunction. There is no prior infarct or current ischemia. Recommed correlating LVEF with echo.   Left ventricular function is abnormal. Nuclear stress EF: 25 %. The left ventricular ejection fraction is severely decreased (<30%). End diastolic cavity size is severely enlarged.   There is a large moderate intensity inferior defect that is most intense in the resting images. There is overall global hypokinesis that is not more pronounced in the inferior wall. Overall findings most consistent with diaphragmatic attenuation as opposed to inferior scar.  Assessment:    1. Chronic HFrEF (heart failure with reduced ejection  fraction) (HCC)   2. Status post aortic valve replacement   3. Medication management   4. Essential hypertension   5. Mixed hyperlipidemia      Plan:   In order of problems listed above:  1. HFrEF - His EF was reduced at 30 to 35% in 09/2021 and NST at that time showed no evidence of prior infarct or current ischemia, overall consistent with a nonischemic cardiomyopathy. - He reports overall doing well since his last visit and denies any dyspnea on exertion, orthopnea, PND or lower extremity edema. - He reports good compliance with his current medication regimen and is currently on Coreg 12.5 mg twice daily, Entresto 49-51 mg twice daily and Spironolactone 12.5 mg daily.  Will plan to titrate Spironolactone to 25 mg daily. Can stop Amlodipine if BP becomes too soft over time. Repeat BMET in 2-3 weeks. We did discuss the possible addition of an SGLT-2 inhibitor but he is hesitant to be on additional medications at this time. Will plan to obtain a limited echocardiogram in the next month for reassessment of his EF and if this remains reduced, he is in agreement to starting [place] or Jardiance at that time.  2. Severe AI - He is s/p St. Jude mechanical AVR in 2003 with Bentall procedure and echocardiogram in 09/2021 showed only mild aortic valve regurgitation. - He remains on Coumadin for anticoagulation which is followed by our clinic in [place]. No reports of active bleeding. His INR was slightly supratherapeutic at 3.8 when checked earlier this week.  3. HTN - His BP is well controlled at 132/86 during today's visit. He is on Coreg, Entresto, Spironolactone and Amlodipine and we will titrate Spironolactone as outlined above. He does have a home BP cuff but does not check this routinely, therefore I did encourage him to follow this on occasion to make sure he is not developing any hypotension with titration of medical therapy.  4. HLD - Followed by his PCP. He remains on Simvastatin 40 mg  daily. Would not further titrate given the concurrent use of Amlodipine.   Medication Adjustments/Labs and Tests Ordered: Current medicines are reviewed at length with the patient today.  Concerns regarding medicines are outlined above.  Medication changes,  Labs and Tests ordered today are listed in the Patient Instructions below. Patient Instructions  Medication Instructions:   Increase Spironolactone to 25 mg Daily   *If you need a refill on your cardiac medications before your next appointment, please call your pharmacy*   Lab Work: Your physician recommends that you return for lab work in: 1 month same day as echo     If you have labs (blood work) drawn today and your tests are completely normal, you will receive your results only by: MyChart Message (if you have MyChart) OR A paper copy in the mail If you have any lab test that is abnormal or we need to change your treatment, we will call you to review the results.   Testing/Procedures: Your physician has requested that you have an echocardiogram. Echocardiography is a painless test that uses sound waves to create images of your heart. It provides your doctor with information about the size and shape of your heart and how well your hearts chambers and valves are working. This procedure takes approximately one hour. There are no restrictions for this procedure.    Follow-Up: At [facility], you and your health needs are our priority.  As part of our continuing mission to provide you with exceptional heart care, we have created designated Provider Care Teams.  These Care Teams include your primary Cardiologist (physician) and Advanced Practice Providers (APPs -  Physician Assistants and Nurse Practitioners) who all work together to provide you with the care you need, when you need it.  We recommend signing up for the patient portal called "MyChart".  Sign up information is provided on this After Visit Summary.  MyChart is used  to connect with patients for Virtual Visits (Telemedicine).  Patients are able to view lab/test results, encounter notes, upcoming appointments, etc.  Non-urgent messages can be sent to your provider as well.   To learn more about what you can do with MyChart, go to [email or web address].    Your next appointment:   6 -8 week(s)  The format for your next appointment:   In Person  Provider:   Randall An, PA-C or Dr. Armanda Magic     Other Instructions Thank you for choosing [facility]!      Signed, Ellsworth Lennox, PA-C  12/30/2021 2:53 PM    [facility] [address] [place], [place] [address] Phone: [phone] Fax: [phone]

## 2021-12-30 ENCOUNTER — Encounter: Payer: Self-pay | Admitting: Student

## 2021-12-30 ENCOUNTER — Ambulatory Visit (INDEPENDENT_AMBULATORY_CARE_PROVIDER_SITE_OTHER): Payer: Medicare HMO | Admitting: Student

## 2021-12-30 ENCOUNTER — Other Ambulatory Visit: Payer: Self-pay

## 2021-12-30 VITALS — BP 132/86 | HR 64 | Ht 73.0 in | Wt 247.0 lb

## 2021-12-30 DIAGNOSIS — Z952 Presence of prosthetic heart valve: Secondary | ICD-10-CM | POA: Diagnosis not present

## 2021-12-30 DIAGNOSIS — Z79899 Other long term (current) drug therapy: Secondary | ICD-10-CM

## 2021-12-30 DIAGNOSIS — I5022 Chronic systolic (congestive) heart failure: Secondary | ICD-10-CM

## 2021-12-30 DIAGNOSIS — I10 Essential (primary) hypertension: Secondary | ICD-10-CM | POA: Diagnosis not present

## 2021-12-30 DIAGNOSIS — E782 Mixed hyperlipidemia: Secondary | ICD-10-CM

## 2021-12-30 MED ORDER — SPIRONOLACTONE 25 MG PO TABS: 25.0000 mg | ORAL_TABLET | Freq: Every day | ORAL | 3 refills | Status: DC

## 2021-12-30 NOTE — Patient Instructions (Addendum)
Medication Instructions:   Increase Spironolactone to 25 mg Daily   *If you need a refill on your cardiac medications before your next appointment, please call your pharmacy*   Lab Work: Your physician recommends that you return for lab work in: 1 month same day as echo     If you have labs (blood work) drawn today and your tests are completely normal, you will receive your results only by: MyChart Message (if you have MyChart) OR A paper copy in the mail If you have any lab test that is abnormal or we need to change your treatment, we will call you to review the results.   Testing/Procedures: Your physician has requested that you have an echocardiogram. Echocardiography is a painless test that uses sound waves to create images of your heart. It provides your doctor with information about the size and shape of your heart and how well your hearts chambers and valves are working. This procedure takes approximately one hour. There are no restrictions for this procedure.    Follow-Up: At [facility], you and your health needs are our priority.  As part of our continuing mission to provide you with exceptional heart care, we have created designated Provider Care Teams.  These Care Teams include your primary Cardiologist (physician) and Advanced Practice Providers (APPs -  Physician Assistants and Nurse Practitioners) who all work together to provide you with the care you need, when you need it.  We recommend signing up for the patient portal called "MyChart".  Sign up information is provided on this After Visit Summary.  MyChart is used to connect with patients for Virtual Visits (Telemedicine).  Patients are able to view lab/test results, encounter notes, upcoming appointments, etc.  Non-urgent messages can be sent to your provider as well.   To learn more about what you can do with MyChart, go to [email or web address].    Your next appointment:   6 -8 week(s)  The format for your  next appointment:   In Person  Provider:   Randall An, PA-C or Dr. Armanda Magic     Other Instructions Thank you for choosing [facility]!

## 2021-12-30 NOTE — Addendum Note (Signed)
Addended by: Levonne Hubert on: 12/30/2021 03:33 PM   Modules accepted: Orders

## 2022-01-03 ENCOUNTER — Ambulatory Visit: Payer: Medicare HMO | Admitting: Student

## 2022-01-09 ENCOUNTER — Telehealth: Payer: Self-pay | Admitting: Student

## 2022-01-09 MED ORDER — WARFARIN SODIUM 5 MG PO TABS: ORAL_TABLET | ORAL | 0 refills | Status: DC

## 2022-01-09 NOTE — Telephone Encounter (Signed)
°*  STAT* If patient is at the pharmacy, call can be transferred to refill team.   1. Which medications need to be refilled? (please list name of each medication and dose if known) warfarin (COUMADIN) 5 MG tablet  2. Which pharmacy/location (including street and city if local pharmacy) is medication to be sent to?[facility] - [place], [place] - [address]  3. Do they need a 30 day or 90 day supply?  30 day supply   Mail service will not arrive in time. Ran out last night.

## 2022-01-25 ENCOUNTER — Other Ambulatory Visit: Payer: Self-pay | Admitting: Student

## 2022-01-25 MED ORDER — TAMSULOSIN HCL 0.4 MG PO CAPS: 0.4000 mg | ORAL_CAPSULE | Freq: Every day | ORAL | 1 refills | Status: DC

## 2022-01-27 ENCOUNTER — Ambulatory Visit (HOSPITAL_COMMUNITY)
Admission: RE | Admit: 2022-01-27 | Discharge: 2022-01-27 | Disposition: A | Discharge status: Home / Self Care | Payer: Medicare HMO | Source: Ambulatory Visit | Attending: Student | Admitting: Student

## 2022-01-27 DIAGNOSIS — I5022 Chronic systolic (congestive) heart failure: Secondary | ICD-10-CM | POA: Diagnosis present

## 2022-01-27 LAB — ECHOCARDIOGRAM LIMITED
Calc EF: 38.4 %
S' Lateral: 4.6 cm
Single Plane A2C EF: 40.2 %
Single Plane A4C EF: 36.5 %

## 2022-01-27 NOTE — Progress Notes (Signed)
*  PRELIMINARY RESULTS* ?Echocardiogram ?Limited 2-D Echocardiogram  has been performed. ? ?[name] ?01/27/2022, 12:30 PM ?

## 2022-01-30 ENCOUNTER — Telehealth: Payer: Self-pay | Admitting: *Deleted

## 2022-01-30 DIAGNOSIS — Z79899 Other long term (current) drug therapy: Secondary | ICD-10-CM

## 2022-01-30 MED ORDER — ENTRESTO 97-103 MG PO TABS: 1.0000 | ORAL_TABLET | Freq: Two times a day (BID) | ORAL | 11 refills | Status: DC

## 2022-01-30 NOTE — Telephone Encounter (Signed)
Pt.notified

## 2022-01-30 NOTE — Telephone Encounter (Signed)
-----   Message from Ellsworth Lennox, [place] sent at 01/29/2022  8:09 AM EDT ----- ?Please let the patient know his ejection fraction still remains reduced at 30-35% which is overall unchanged when compared to prior images. Would recommend stopping Amlodipine and titrating Entresto to 97-103mg  BID. Repeat BMET in 7-10 days. Please ask him to follow BP at home. If unable to, would recommend Nurse Visit for BP check in 2 weeks after medication adjustments. Next step would be to add Jardiance or Marcelline Deist if renal function remains stable following dose adjustment of Entresto.  ?

## 2022-02-01 ENCOUNTER — Ambulatory Visit (INDEPENDENT_AMBULATORY_CARE_PROVIDER_SITE_OTHER): Payer: Medicare HMO | Admitting: *Deleted

## 2022-02-01 DIAGNOSIS — Z5181 Encounter for therapeutic drug level monitoring: Secondary | ICD-10-CM | POA: Diagnosis not present

## 2022-02-01 DIAGNOSIS — Z952 Presence of prosthetic heart valve: Secondary | ICD-10-CM | POA: Diagnosis not present

## 2022-02-01 LAB — POCT INR: INR: 1.6 — AB (ref 2.0–3.0)

## 2022-02-01 NOTE — Patient Instructions (Signed)
Take warfarin 3 tablets tonight and tomorrow night then continue 2 tablets daily ?-Recheck INR in 2 wks ?

## 2022-02-15 ENCOUNTER — Ambulatory Visit (INDEPENDENT_AMBULATORY_CARE_PROVIDER_SITE_OTHER): Payer: Medicare HMO | Admitting: *Deleted

## 2022-02-15 DIAGNOSIS — Z952 Presence of prosthetic heart valve: Secondary | ICD-10-CM | POA: Diagnosis not present

## 2022-02-15 DIAGNOSIS — Z5181 Encounter for therapeutic drug level monitoring: Secondary | ICD-10-CM

## 2022-02-15 LAB — POCT INR: INR: 4.5 — AB (ref 2.0–3.0)

## 2022-02-15 NOTE — Patient Instructions (Signed)
Hold warfarin tonight, take 1 tablet tomorrow night then continue 2 tablets daily ?-Recheck INR in 2 wks ?

## 2022-02-23 ENCOUNTER — Encounter: Payer: Self-pay | Admitting: Cardiology

## 2022-02-23 ENCOUNTER — Ambulatory Visit (INDEPENDENT_AMBULATORY_CARE_PROVIDER_SITE_OTHER): Payer: Medicare HMO | Admitting: Cardiology

## 2022-02-23 VITALS — BP 132/78 | HR 57 | Wt 243.0 lb

## 2022-02-23 DIAGNOSIS — I2510 Atherosclerotic heart disease of native coronary artery without angina pectoris: Secondary | ICD-10-CM

## 2022-02-23 DIAGNOSIS — E7800 Pure hypercholesterolemia, unspecified: Secondary | ICD-10-CM

## 2022-02-23 DIAGNOSIS — I351 Nonrheumatic aortic (valve) insufficiency: Secondary | ICD-10-CM

## 2022-02-23 DIAGNOSIS — I428 Other cardiomyopathies: Secondary | ICD-10-CM

## 2022-02-23 DIAGNOSIS — I10 Essential (primary) hypertension: Secondary | ICD-10-CM | POA: Diagnosis not present

## 2022-02-23 MED ORDER — DAPAGLIFLOZIN PROPANEDIOL 10 MG PO TABS: 10.0000 mg | ORAL_TABLET | Freq: Every day | ORAL | 3 refills | Status: DC

## 2022-02-23 NOTE — Addendum Note (Signed)
Addended by: Theresia Majors on: 02/23/2022 11:49 AM ? ? Modules accepted: Orders ? ?

## 2022-02-23 NOTE — Patient Instructions (Signed)
Medication Instructions:  ?Your physician has recommended you make the following change in your medication:  ?1) START taking Farxiga 10 mg daily  ? ?*If you need a refill on your cardiac medications before your next appointment, please call your pharmacy* ? ?Lab Work: ?TODAY: Fasting lipids and CMET ? ?If you have labs (blood work) drawn today and your tests are completely normal, you will receive your results only by: ?MyChart Message (if you have MyChart) OR ?A paper copy in the mail ?If you have any lab test that is abnormal or we need to change your treatment, we will call you to review the results. ? ?Follow-Up: ?At [facility], you and your health needs are our priority.  As part of our continuing mission to provide you with exceptional heart care, we have created designated Provider Care Teams.  These Care Teams include your primary Cardiologist (physician) and Advanced Practice Providers (APPs -  Physician Assistants and Nurse Practitioners) who all work together to provide you with the care you need, when you need it. ? ?Your next appointment:   ?6 month(s) ? ?The format for your next appointment:   ?In Person ? ?Provider:   ?You may see Armanda Magic, MD or one of the following Advanced Practice Providers on your designated Care Team:   ?Randall An, PA-C  ?Jacolyn Reedy, PA-C { ? ?Other Instructions ?You have been referred to see an Electrophysiologist  ?

## 2022-02-23 NOTE — Progress Notes (Signed)
? ? ?Cardiology Office Note ? ?Date: 02/23/2022  ? ?ID: [name], DOB 1951/01/16, MRN [id number] ? ?PCP:  Default, Provider, MD  ?Cardiologist:  [name] Magic, MD ?Electrophysiologist:  None  ? ?Chief Complaint: status post AVR ? ?History of Present Illness: ?[name] is a 71 y.o. male with a history of AVR, HTN, HLD, previous cardiac catheterization: Normal coronary arteries in setting of DCM with EF 25-30%.. 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 ? ?He had a NST in Nov 2022 due to echo showed worsening LVF with EF 30-35% from 45% prior.  Stress test showed no ischemia.  At last OV his spiro was increased to 25mg  daily and Entresto was increased to 97-103mg  BID.  Repeat echo on GDMT showed persistence of LV dysfunction with EF 30-35%.   ? ?He is here today for followup and is doing well.  He denies any chest pain or pressure, SOB, DOE(except with exercise), PND, orthopnea, LE edema, dizziness, palpitations or syncope. He is compliant with his meds and is tolerating meds with no SE.    ? ?Past Medical History:  ?Diagnosis Date  ? Aortic insufficiency 2003  ? S/P St Jude mechanicl AVR on chronic anticoagulation with warfarin  ? CAD (coronary artery disease), native coronary artery 2003  ? 20% pLADm at cath  ? HLD (hyperlipidemia)   ? Hypertension   ? NICM (nonischemic cardiomyopathy) (HCC)   ? EF 20-25% at time of AVR and improved to 45% by echo 2017 and now  30-35% by echo 09/2021  ? Poor historian   ? Sexual dysfunction   ? ? ?Past Surgical History:  ?Procedure Laterality Date  ? CARDIAC CATHETERIZATION  2001  ? CIRCUMCISION  11/20/2004  ? [name]--which was uncomplicated  ? EXPLORATION POST OPERATIVE OPEN HEART    ? open heart surgery  ? ? ?Current Outpatient Medications  ?Medication Sig Dispense Refill  ? aspirin EC 81 MG tablet Take 1 tablet (81 mg total)  by mouth daily. Swallow whole. 90 tablet 3  ? carvedilol (COREG) 12.5 MG tablet Take 1 tablet (12.5 mg total) by mouth 2 (two) times daily. 180 tablet 3  ? HYDROcodone-acetaminophen (NORCO/VICODIN) 5-325 MG tablet Take 1 tablet by mouth every 6 (six) hours as needed for severe pain. 6 tablet 0  ? sacubitril-valsartan (ENTRESTO) 97-103 MG Take 1 tablet by mouth 2 (two) times daily. 60 tablet 11  ? simvastatin (ZOCOR) 40 MG tablet Take 1 tablet (40 mg total) by mouth at bedtime. 90 tablet 3  ? spironolactone (ALDACTONE) 25 MG tablet Take 1 tablet (25 mg total) by mouth daily. 90 tablet 3  ? tamsulosin (FLOMAX) 0.4 MG CAPS capsule Take 1 capsule (0.4 mg total) by mouth at bedtime. 90 capsule 1  ? warfarin (COUMADIN) 5 MG tablet Take 2 tablets daily or as directed 60 tablet 0  ? ?No current facility-administered medications for this visit.  ? ?Allergies:  Iohexol, Aspirin, and Contrast media [iodinated contrast media]  ? ?Social History: The patient  reports that he has been smoking cigarettes. He has never used smokeless tobacco. He reports current drug use. Drug: Marijuana. He reports that he does not drink alcohol.  ? ?Family History: The patient's family history includes Alcoholism in his father; Diabetes in his mother; Hypertension in his mother.  ? ?ROS:  Please see the history of present illness.  Otherwise, complete review of systems is positive for none.  All other systems are reviewed and negative.  ? ?Physical Exam: ?VS:  BP 132/78   Pulse (!) 57   Wt 243 lb (110.2 kg)   SpO2 98%   BMI 32.06 kg/m? , BMI Body mass index is 32.06 kg/m?. ? ?Wt Readings from Last 3 Encounters:  ?02/23/22 243 lb (110.2 kg)  ?12/30/21 247 lb (112 kg)  ?11/10/21 244 lb (110.7 kg)  ?  ?GEN: Well nourished, well developed in no acute distress ?HEENT: Normal ?NECK: No JVD; No carotid bruits ?LYMPHATICS: No lymphadenopathy ?CARDIAC:RRR, no murmurs, rubs, gallops ?RESPIRATORY:  Clear to auscultation without rales, wheezing or rhonchi   ?ABDOMEN: Soft, non-tender, non-distended ?MUSCULOSKELETAL:  No edema; No deformity  ?SKIN: Warm and dry ?NEUROLOGIC:  Alert and oriented x 3 ?PSYCHIATRIC:  Normal affect   ? ?ECG: EKG was not performed in the office today abnormality ? ?Recent Labwork: ?08/24/2021: ALT 9; AST 14; Hemoglobin 12.8; Platelets 204 ?11/10/2021: BUN 28; Creatinine, Ser 1.16; Potassium 3.8; Sodium 133  ?   ?Component Value Date/Time  ? CHOL 180 04/29/2018 1032  ? TRIG 67 04/29/2018 1032  ? HDL 72 04/29/2018 1032  ? CHOLHDL 2.5 04/29/2018 1032  ? VLDL 13 04/29/2018 1032  ? LDLCALC 95 04/29/2018 1032  ? ? ?Other Studies Reviewed Today: ? ?Most recent echocardiogram performed on 07/16/2016 demonstrated mildly reduced left ventricular systolic function, LVEF 45 to 76%, moderate LVH, normal regional wall motion, normally functioning tilting-disc aortic valve prosthesis, moderate biatrial dilatation, and mildly dilated right ventricle ? ? ? ?Assessment and Plan: ? ?1. Severe AR ?-s/p mechanical AVR ?-2D echo 09/2021 showed stable 25 mm St Jude mechanical AVR with mean aortic valve gradient 15 mmHg and mild perivalvular AI. ?-Continue prescription drug management with aspirin 81 mg daily and Coumadin ?-He denies any bleeding problems and INR is followed in Coumadin clinic ? ?2. Essential hypertension ?-BP is well controlled on exam today ?-Continue prescription drug management with carvedilol 12.5 mg twice daily, Entresto 97-103 mg twice daily, spironolactone 25 mg daily with as needed refills ?-I have personally reviewed and interpreted outside labs performed by patient's PCP which showed serum creatinine 1.16 and potassium 3.8 on 11/10/2021 ?-I will repeat another BMET today ? ?3. Mixed hyperlipidemia ?-LDL goal < 70 ?-Check FLP and ALT ?-Continue prescription drug management with simvastatin 40 mg daily with as needed refills ? ?4. Cardiomyopathy, unspecified type (HCC)/Chronic combined systolic/diastolic CHF ?-2D echo 2017 with EF 45% and  repeat 2D echo 10/09/2021 showed a further reduction in EF to 30 to 35% with global hypokinesis. And repeat limited echo 12/2021 showed persistence of LV dysfunction with no improvement in EF   ?-Nuclear stress test 10/10/2021 showed no ischemia and fixed defect in the inferior wall consistent with prior infarct ?-Given worsening LV dysfunction I recommended getting coronary CTA to define coronary anatomy.  He had minimal CAD by cath in 2003. ?-He appears euvolemic on exam today  ?-Continue prescription drug management with carvedilol 12.5 mg twice daily, Entresto 97-23 mg twice daily, spironolactone 25 mg daily  ?-add Farxiga 10 mg daily  ?-Cannot increase carvedilol further due to bradycardia ?-He is now on GDMT with no improvement in EF and I have recommended referral to EP for possible AICD placement>>he is not sure he wants it done but is willing to see EP to discuss the procedure ? ?5.  ASCAD ?-Minimal plaque in the proximal LAD in 2001 in the setting of severe LV  dysfunction at that time with EF 25 to 30% ?-Recent echo stress test showed no ischemia ?-Continue aspirin 81 mg daily, beta-blocker and statin ? ? ?Medication Adjustments/Labs and Tests Ordered: ?Current medicines are reviewed at length with the patient today.  Concerns regarding medicines are outlined above.  ? ?Disposition: Follow-up with Dr. Wyline Mood or APP 1 year ? ?Signed, ?Rennis Harding, NP ?02/23/2022 11:31 AM    ?[facility] at [facility] ?[address] [place], [place], [place] [address] ?Phone: [phone]; Fax: [phone] ?

## 2022-03-22 ENCOUNTER — Ambulatory Visit (INDEPENDENT_AMBULATORY_CARE_PROVIDER_SITE_OTHER): Payer: Medicare HMO | Admitting: *Deleted

## 2022-03-22 DIAGNOSIS — Z952 Presence of prosthetic heart valve: Secondary | ICD-10-CM

## 2022-03-22 DIAGNOSIS — Z5181 Encounter for therapeutic drug level monitoring: Secondary | ICD-10-CM

## 2022-03-22 LAB — POCT INR: INR: 4 — AB (ref 2.0–3.0)

## 2022-03-22 NOTE — Patient Instructions (Signed)
Description   ?Hold warfarin today ?Then START taking warfarin 2 tablets daily except for 1 tablet on Wednesdays. Recheck in 2 weeks.  ?  ? ? ?

## 2022-04-05 ENCOUNTER — Ambulatory Visit (INDEPENDENT_AMBULATORY_CARE_PROVIDER_SITE_OTHER): Payer: Medicare HMO | Admitting: *Deleted

## 2022-04-05 DIAGNOSIS — Z5181 Encounter for therapeutic drug level monitoring: Secondary | ICD-10-CM | POA: Diagnosis not present

## 2022-04-05 DIAGNOSIS — Z952 Presence of prosthetic heart valve: Secondary | ICD-10-CM

## 2022-04-05 LAB — POCT INR: INR: 3.5 — AB (ref 2.0–3.0)

## 2022-04-05 NOTE — Patient Instructions (Signed)
Has been taking 2 tablets daily ?Decrease dose to 2 tablets daily except for 1 tablet on Wednesdays. Recheck in 3 weeks.  ?

## 2022-04-12 ENCOUNTER — Emergency Department (HOSPITAL_COMMUNITY): Payer: Medicare HMO

## 2022-04-12 ENCOUNTER — Emergency Department (HOSPITAL_COMMUNITY)
Admission: EM | Admit: 2022-04-12 | Discharge: 2022-04-12 | Disposition: A | Discharge status: Home / Self Care | Payer: Medicare HMO | Attending: Emergency Medicine | Admitting: Emergency Medicine

## 2022-04-12 ENCOUNTER — Encounter (HOSPITAL_COMMUNITY): Payer: Self-pay | Admitting: *Deleted

## 2022-04-12 ENCOUNTER — Other Ambulatory Visit: Payer: Self-pay

## 2022-04-12 DIAGNOSIS — Z79899 Other long term (current) drug therapy: Secondary | ICD-10-CM | POA: Insufficient documentation

## 2022-04-12 DIAGNOSIS — Z7982 Long term (current) use of aspirin: Secondary | ICD-10-CM | POA: Insufficient documentation

## 2022-04-12 DIAGNOSIS — Z7901 Long term (current) use of anticoagulants: Secondary | ICD-10-CM | POA: Diagnosis not present

## 2022-04-12 DIAGNOSIS — R079 Chest pain, unspecified: Secondary | ICD-10-CM | POA: Diagnosis present

## 2022-04-12 DIAGNOSIS — R0789 Other chest pain: Secondary | ICD-10-CM | POA: Diagnosis not present

## 2022-04-12 DIAGNOSIS — I10 Essential (primary) hypertension: Secondary | ICD-10-CM | POA: Insufficient documentation

## 2022-04-12 LAB — CBC
HCT: 37.5 % — ABNORMAL LOW (ref 39.0–52.0)
Hemoglobin: 12.1 g/dL — ABNORMAL LOW (ref 13.0–17.0)
MCH: 25.4 pg — ABNORMAL LOW (ref 26.0–34.0)
MCHC: 32.3 g/dL (ref 30.0–36.0)
MCV: 78.6 fL — ABNORMAL LOW (ref 80.0–100.0)
Platelets: 192 10*3/uL (ref 150–400)
RBC: 4.77 MIL/uL (ref 4.22–5.81)
RDW: 15.5 % (ref 11.5–15.5)
WBC: 3.7 10*3/uL — ABNORMAL LOW (ref 4.0–10.5)
nRBC: 0 % (ref 0.0–0.2)

## 2022-04-12 LAB — COMPREHENSIVE METABOLIC PANEL
ALT: 10 U/L (ref 0–44)
AST: 15 U/L (ref 15–41)
Albumin: 3.7 g/dL (ref 3.5–5.0)
Alkaline Phosphatase: 71 U/L (ref 38–126)
Anion gap: 3 — ABNORMAL LOW (ref 5–15)
BUN: 17 mg/dL (ref 8–23)
CO2: 20 mmol/L — ABNORMAL LOW (ref 22–32)
Calcium: 8.3 mg/dL — ABNORMAL LOW (ref 8.9–10.3)
Chloride: 114 mmol/L — ABNORMAL HIGH (ref 98–111)
Creatinine, Ser: 1.22 mg/dL (ref 0.61–1.24)
GFR, Estimated: >60 mL/min (ref >60)
Glucose, Bld: 104 mg/dL — ABNORMAL HIGH (ref 70–99)
Potassium: 3.6 mmol/L (ref 3.5–5.1)
Sodium: 137 mmol/L (ref 135–145)
Total Bilirubin: 0.5 mg/dL (ref 0.3–1.2)
Total Protein: 6.8 g/dL (ref 6.5–8.1)

## 2022-04-12 LAB — PROTIME-INR
INR: 4.4 (ref 0.8–1.2)
Prothrombin Time: 41.6 seconds — ABNORMAL HIGH (ref 11.4–15.2)

## 2022-04-12 LAB — TROPONIN I (HIGH SENSITIVITY)
Troponin I (High Sensitivity): 36 ng/L — ABNORMAL HIGH (ref <18)
Troponin I (High Sensitivity): 34 ng/L — ABNORMAL HIGH (ref <18)

## 2022-04-12 LAB — CBG MONITORING, ED: Glucose-Capillary: 95 mg/dL (ref 70–99)

## 2022-04-12 NOTE — ED Notes (Signed)
Date and time results received: 04/12/22 1217 (use smartphrase ".now" to insert current time)  Test: INR Critical Value: 4.4  Name of Provider Notified: Dr. Donald Prose  Orders Received? Or Actions Taken?: no/na
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
## 2022-04-12 NOTE — Discharge Instructions (Signed)
As discussed, your evaluation today has been largely reassuring.  But, it is important that you monitor your condition carefully, and do not hesitate to return to the ED if you develop new, or concerning changes in your condition. ? ?Otherwise, please follow-up with your physician for appropriate ongoing care. ? ?

## 2022-05-01 ENCOUNTER — Ambulatory Visit (INDEPENDENT_AMBULATORY_CARE_PROVIDER_SITE_OTHER): Payer: Medicare HMO | Admitting: *Deleted

## 2022-05-01 DIAGNOSIS — Z5181 Encounter for therapeutic drug level monitoring: Secondary | ICD-10-CM | POA: Diagnosis not present

## 2022-05-01 DIAGNOSIS — Z952 Presence of prosthetic heart valve: Secondary | ICD-10-CM | POA: Diagnosis not present

## 2022-05-01 LAB — POCT INR: INR: 3.5 — AB (ref 2.0–3.0)

## 2022-05-01 NOTE — Patient Instructions (Signed)
Continue warfarin 2 tablets daily except for 1 tablet on Wednesdays. Recheck in 4 weeks.

## 2022-05-02 ENCOUNTER — Encounter: Payer: Self-pay | Admitting: Internal Medicine

## 2022-05-02 ENCOUNTER — Ambulatory Visit (INDEPENDENT_AMBULATORY_CARE_PROVIDER_SITE_OTHER): Payer: Medicare HMO | Admitting: Internal Medicine

## 2022-05-02 VITALS — BP 140/70 | HR 32 | Ht 73.0 in | Wt 236.0 lb

## 2022-05-02 DIAGNOSIS — I2510 Atherosclerotic heart disease of native coronary artery without angina pectoris: Secondary | ICD-10-CM | POA: Diagnosis not present

## 2022-05-02 DIAGNOSIS — I5022 Chronic systolic (congestive) heart failure: Secondary | ICD-10-CM

## 2022-05-02 NOTE — Progress Notes (Signed)
HPI [name] is referred by Dr. Radford Pax for consideration of ICD insertion. He is a pleasant 71 yo man with a h/o AVR and is s/p aortic root replacment. He has LV dysfunction with an EF of 30%. He has not had syncope. He has IVCD/LBBB. He has been on maximal GDMT. He has class 2 symptoms. No chest pain.  Allergies  Allergen Reactions   Iohexol Hives, Itching and Swelling    Extremity swelling and hives   Aspirin     On coumadin   Contrast Media [Iodinated Contrast Media]      Current Outpatient Medications  Medication Sig Dispense Refill   aspirin EC 81 MG tablet Take 1 tablet (81 mg total) by mouth daily. Swallow whole. 90 tablet 3   carvedilol (COREG) 12.5 MG tablet Take 1 tablet (12.5 mg total) by mouth 2 (two) times daily. 180 tablet 3   dapagliflozin propanediol (FARXIGA) 10 MG TABS tablet Take 1 tablet (10 mg total) by mouth daily before breakfast. 90 tablet 3   sacubitril-valsartan (ENTRESTO) 97-103 MG Take 1 tablet by mouth 2 (two) times daily. 60 tablet 11   simvastatin (ZOCOR) 40 MG tablet Take 1 tablet (40 mg total) by mouth at bedtime. 90 tablet 3   spironolactone (ALDACTONE) 25 MG tablet Take 1 tablet (25 mg total) by mouth daily. 90 tablet 3   tamsulosin (FLOMAX) 0.4 MG CAPS capsule Take 1 capsule (0.4 mg total) by mouth at bedtime. 90 capsule 1   warfarin (COUMADIN) 5 MG tablet Take 2 tablets daily or as directed (Patient taking differently: Take 10 mg by mouth daily at 6 (six) AM.) 60 tablet 0   No current facility-administered medications for this visit.     Past Medical History:  Diagnosis Date   Aortic insufficiency 2003   S/P St Jude mechanicl AVR on chronic anticoagulation with warfarin   CAD (coronary artery disease), native coronary artery 2003   20% pLADm at cath   HLD (hyperlipidemia)    Hypertension    NICM (nonischemic cardiomyopathy) ([place])    EF 20-25% at time of AVR and improved to 45% by echo 2017 and now  30-35% by echo 09/2021   Poor  historian    Sexual dysfunction     ROS:   All systems reviewed and negative except as noted in the HPI.   Past Surgical History:  Procedure Laterality Date   CARDIAC CATHETERIZATION  2001   CIRCUMCISION  11/20/2004   [name] was uncomplicated   EXPLORATION POST OPERATIVE OPEN HEART     open heart surgery     Family History  Problem Relation Age of Onset   Hypertension Mother    Diabetes Mother    Alcoholism Father      Social History   Socioeconomic History   Marital status: Divorced    Spouse name: Not on file   Number of children: Not on file   Years of education: Not on file   Highest education level: Not on file  Occupational History   Not on file  Tobacco Use   Smoking status: Some Days    Types: Cigarettes   Smokeless tobacco: Never  Vaping Use   Vaping Use: Never used  Substance and Sexual Activity   Alcohol use: No   Drug use: Yes    Types: Marijuana    Comment: last use 1 month ago   Sexual activity: Yes    Birth control/protection: None  Other Topics Concern  Not on file  Social History Narrative   Not on file   Social Determinants of Health   Financial Resource Strain: Not on file  Food Insecurity: Not on file  Transportation Needs: Not on file  Physical Activity: Not on file  Stress: Not on file  Social Connections: Not on file  Intimate Partner Violence: Not on file     BP 140/70 (BP Location: Left Arm, Patient Position: Sitting, Cuff Size: Normal)   Pulse (!) 32   Ht 6\' 1"  (1.854 m)   Wt 236 lb (107 kg)   SpO2 98%   BMI 31.14 kg/m   Physical Exam:  Well appearing NAD HEENT: Unremarkable Neck:  No JVD, no thyromegally Lymphatics:  No adenopathy Back:  No CVA tenderness Lungs:  Clear HEART:  Regular rate rhythm, no murmurs, no rubs, no clicks Abd:  soft, positive bowel sounds, no organomegally, no rebound, no guarding Ext:  2 plus pulses, no edema, no cyanosis, no clubbing Skin:  No rashes no nodules Neuro:   CN II through XII intact, motor grossly intact  Assess/Plan:  Chronic systolic heart failure - he has class 2 symptoms despite GDMT and his EF is 30%. I have offered him ICD insertion but he does not think that he wants to have this done. I explained the rationale for the procedure. He will call us if he wishes to proceed. AVR - his valve sounds as if it is working normally.  HTN -his bp is well controlled. No change in meds.  Carleene Overlie [name],MD

## 2022-05-02 NOTE — Patient Instructions (Signed)
Medication Instructions:  Your physician recommends that you continue on your current medications as directed. Please refer to the Current Medication list given to you today.  *If you need a refill on your cardiac medications before your next appointment, please call your pharmacy*   Lab Work: NONE  If you have labs (blood work) drawn today and your tests are completely normal, you will receive your results only by: [place] (if you have MyChart) OR A paper copy in the mail If you have any lab test that is abnormal or we need to change your treatment, we will call you to review the results.   Testing/Procedures: NONE    Follow-Up: At [facility], you and your health needs are our priority.  As part of our continuing mission to provide you with exceptional heart care, we have created designated Provider Care Teams.  These Care Teams include your primary Cardiologist (physician) and Advanced Practice Providers (APPs -  Physician Assistants and Nurse Practitioners) who all work together to provide you with the care you need, when you need it.  We recommend signing up for the patient portal called "MyChart".  Sign up information is provided on this After Visit Summary.  MyChart is used to connect with patients for Virtual Visits (Telemedicine).  Patients are able to view lab/test results, encounter notes, upcoming appointments, etc.  Non-urgent messages can be sent to your provider as well.   To learn more about what you can do with MyChart, go to [email or web address].    Your next appointment:      The format for your next appointment:   In Person  Provider:   Cristopher Peru, MD    Other Instructions Dates to have ICD placed are July 10, July 17, July 18   Thank you for choosing [facility]!    Important Information About Sugar

## 2022-05-29 ENCOUNTER — Ambulatory Visit (INDEPENDENT_AMBULATORY_CARE_PROVIDER_SITE_OTHER): Payer: Medicare HMO | Admitting: *Deleted

## 2022-05-29 DIAGNOSIS — Z5181 Encounter for therapeutic drug level monitoring: Secondary | ICD-10-CM

## 2022-05-29 DIAGNOSIS — Z952 Presence of prosthetic heart valve: Secondary | ICD-10-CM

## 2022-05-29 LAB — POCT INR: INR: 1.6 — AB (ref 2.0–3.0)

## 2022-05-29 NOTE — Patient Instructions (Signed)
Take warfarin 3 tablets tonight and tomorrow then continue warfarin 2 tablets daily except for 1 tablet on Wednesdays. Recheck in 2 weeks.

## 2022-06-05 ENCOUNTER — Other Ambulatory Visit: Payer: Self-pay | Admitting: Cardiology

## 2022-06-05 NOTE — Telephone Encounter (Signed)
Received refill request for warfarin:  Last INR was 1.6 on 05/29/22 Next INR is due on 06/12/22 LOV was 05/02/22  Rosette Reveal MD  Refill approved.

## 2022-06-12 ENCOUNTER — Ambulatory Visit (INDEPENDENT_AMBULATORY_CARE_PROVIDER_SITE_OTHER): Payer: Medicare HMO | Admitting: *Deleted

## 2022-06-12 DIAGNOSIS — Z5181 Encounter for therapeutic drug level monitoring: Secondary | ICD-10-CM

## 2022-06-12 DIAGNOSIS — Z952 Presence of prosthetic heart valve: Secondary | ICD-10-CM

## 2022-06-12 LAB — POCT INR: INR: 1.6 — AB (ref 2.0–3.0)

## 2022-06-12 NOTE — Patient Instructions (Signed)
Take warfarin 3 tablets tonight and tomorrow then increase warfarin to 2 tablets daily  Recheck in 2 weeks.

## 2022-06-14 ENCOUNTER — Other Ambulatory Visit: Payer: Self-pay | Admitting: Cardiology

## 2022-06-21 ENCOUNTER — Other Ambulatory Visit: Payer: Self-pay | Admitting: Student

## 2022-06-26 ENCOUNTER — Ambulatory Visit (INDEPENDENT_AMBULATORY_CARE_PROVIDER_SITE_OTHER): Payer: Medicare Other | Admitting: *Deleted

## 2022-06-26 DIAGNOSIS — Z5181 Encounter for therapeutic drug level monitoring: Secondary | ICD-10-CM | POA: Diagnosis not present

## 2022-06-26 DIAGNOSIS — Z952 Presence of prosthetic heart valve: Secondary | ICD-10-CM

## 2022-06-26 LAB — POCT INR: INR: 1.9 — AB (ref 2.0–3.0)

## 2022-06-26 NOTE — Patient Instructions (Signed)
Take warfarin 3 tablets tonight and tomorrow then continue 2 tablets daily  Recheck in 3 weeks.

## 2022-07-17 ENCOUNTER — Ambulatory Visit: Payer: Medicare Other | Attending: Cardiology | Admitting: *Deleted

## 2022-07-17 DIAGNOSIS — Z5181 Encounter for therapeutic drug level monitoring: Secondary | ICD-10-CM

## 2022-07-17 DIAGNOSIS — Z952 Presence of prosthetic heart valve: Secondary | ICD-10-CM

## 2022-07-17 LAB — POCT INR: INR: 6.9 — AB (ref 2.0–3.0)

## 2022-07-17 NOTE — Patient Instructions (Signed)
Hold warfarin x 3 days then resume 2 tablets daily  Recheck in 1 week

## 2022-07-26 ENCOUNTER — Ambulatory Visit: Payer: Medicare Other | Attending: Cardiology | Admitting: *Deleted

## 2022-07-26 DIAGNOSIS — Z952 Presence of prosthetic heart valve: Secondary | ICD-10-CM | POA: Diagnosis not present

## 2022-07-26 DIAGNOSIS — Z5181 Encounter for therapeutic drug level monitoring: Secondary | ICD-10-CM | POA: Diagnosis not present

## 2022-07-26 LAB — POCT INR: INR: 2.8 (ref 2.0–3.0)

## 2022-07-26 NOTE — Patient Instructions (Signed)
Continue warfarin 2 tablets daily Recheck in 3 weeks 

## 2022-08-16 ENCOUNTER — Ambulatory Visit: Payer: Medicare Other | Attending: Cardiology | Admitting: *Deleted

## 2022-08-16 DIAGNOSIS — Z5181 Encounter for therapeutic drug level monitoring: Secondary | ICD-10-CM | POA: Diagnosis not present

## 2022-08-16 DIAGNOSIS — Z952 Presence of prosthetic heart valve: Secondary | ICD-10-CM

## 2022-08-16 LAB — POCT INR: INR: 6.1 — AB (ref 2.0–3.0)

## 2022-08-16 NOTE — Patient Instructions (Signed)
Hold warfarin x 3 days then continue 2 tablets daily  Recheck in 1 weeks Start using pill box for warfarin

## 2022-08-23 ENCOUNTER — Ambulatory Visit: Payer: Medicare Other | Attending: Cardiology | Admitting: *Deleted

## 2022-08-23 DIAGNOSIS — Z5181 Encounter for therapeutic drug level monitoring: Secondary | ICD-10-CM

## 2022-08-23 DIAGNOSIS — Z952 Presence of prosthetic heart valve: Secondary | ICD-10-CM | POA: Diagnosis not present

## 2022-08-23 LAB — POCT INR: INR: 2.5 (ref 2.0–3.0)

## 2022-08-23 NOTE — Patient Instructions (Signed)
Continue warfarin 2 tablets daily  Recheck in 3 weeks Keep using pill box for warfarin

## 2022-08-28 ENCOUNTER — Ambulatory Visit: Payer: Medicare Other | Attending: Cardiology | Admitting: Cardiology

## 2022-08-28 ENCOUNTER — Encounter: Payer: Self-pay | Admitting: Cardiology

## 2022-08-28 VITALS — BP 146/92 | HR 46 | Ht 73.0 in | Wt 254.2 lb

## 2022-08-28 DIAGNOSIS — E7800 Pure hypercholesterolemia, unspecified: Secondary | ICD-10-CM

## 2022-08-28 DIAGNOSIS — I428 Other cardiomyopathies: Secondary | ICD-10-CM

## 2022-08-28 DIAGNOSIS — I351 Nonrheumatic aortic (valve) insufficiency: Secondary | ICD-10-CM

## 2022-08-28 DIAGNOSIS — I5022 Chronic systolic (congestive) heart failure: Secondary | ICD-10-CM

## 2022-08-28 DIAGNOSIS — I2510 Atherosclerotic heart disease of native coronary artery without angina pectoris: Secondary | ICD-10-CM

## 2022-08-28 DIAGNOSIS — I10 Essential (primary) hypertension: Secondary | ICD-10-CM

## 2022-08-28 MED ORDER — SPIRONOLACTONE 25 MG PO TABS: 25.0000 mg | ORAL_TABLET | Freq: Every day | ORAL | 3 refills | Status: DC

## 2022-08-28 MED ORDER — CARVEDILOL 6.25 MG PO TABS: 6.2500 mg | ORAL_TABLET | Freq: Two times a day (BID) | ORAL | 3 refills | Status: AC

## 2022-08-28 MED ORDER — PREDNISONE 50 MG PO TABS: ORAL_TABLET | ORAL | 0 refills | Status: DC

## 2022-08-28 MED ORDER — ENTRESTO 97-103 MG PO TABS: 1.0000 | ORAL_TABLET | Freq: Two times a day (BID) | ORAL | 11 refills | Status: DC

## 2022-08-28 MED ORDER — SIMVASTATIN 40 MG PO TABS: 40.0000 mg | ORAL_TABLET | Freq: Every day | ORAL | 3 refills | Status: DC

## 2022-08-28 MED ORDER — DAPAGLIFLOZIN PROPANEDIOL 10 MG PO TABS: 10.0000 mg | ORAL_TABLET | Freq: Every day | ORAL | 3 refills | Status: DC

## 2022-08-28 MED ORDER — CARVEDILOL 6.25 MG PO TABS
6.2500 mg | ORAL_TABLET | Freq: Two times a day (BID) | ORAL | 3 refills | Status: DC
Start: 2022-08-28 — End: 2022-08-28

## 2022-08-28 NOTE — Progress Notes (Addendum)
Cardiology Office Note  Date: 08/28/2022   ID: Jeston Junkins, DOB [birth year], MRN [id number]  PCP:  Default, Provider, MD  Cardiologist:  Armanda Magic, MD Electrophysiologist:  None   Chief Complaint: status post AVR  History of Present Illness: [name] is a 71 y.o. male with a history of AVR, HTN, HLD, previous cardiac catheterization: Normal coronary arteries in setting of DCM with EF 25-30%.. 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  He had a NST in Nov 2022 due to echo showed worsening LVF with EF 30-35% from 45% prior.  Stress test showed no ischemia.  At last OV his spiro was increased to 25mg  daily and Entresto was increased to 97-103mg  BID.  Repeat echo on GDMT showed persistence of LV dysfunction with EF 30-35%.    He is here today for followup and is doing well.  He denies any chest pain or pressure, SOB, DOE, PND, orthopnea, LE edema, dizziness, palpitations or syncope. He is compliant with his meds and is tolerating meds with no SE.    Past Medical History:  Diagnosis Date   Aortic insufficiency 2003   S/P St Jude mechanicl AVR on chronic anticoagulation with warfarin   CAD (coronary artery disease), native coronary artery 2003   20% pLADm at cath   HLD (hyperlipidemia)    Hypertension    NICM (nonischemic cardiomyopathy) (HCC)    EF 20-25% at time of AVR and improved to 45% by echo 2017 and now  30-35% by echo 09/2021   Poor historian    Sexual dysfunction     Past Surgical History:  Procedure Laterality Date   CARDIAC CATHETERIZATION  2001   CIRCUMCISION  11/20/2004   01/18/2005 [facility]--which was uncomplicated   EXPLORATION POST OPERATIVE OPEN HEART     open heart surgery    Current Outpatient Medications  Medication Sig Dispense Refill   aspirin EC 81 MG tablet Take 1 tablet (81 mg total) by mouth daily.  Swallow whole. 90 tablet 3   carvedilol (COREG) 12.5 MG tablet TAKE 1 TABLET TWICE DAILY 180 tablet 3   dapagliflozin propanediol (FARXIGA) 10 MG TABS tablet Take 1 tablet (10 mg total) by mouth daily before breakfast. 90 tablet 3   sacubitril-valsartan (ENTRESTO) 97-103 MG Take 1 tablet by mouth 2 (two) times daily. 60 tablet 11   simvastatin (ZOCOR) 40 MG tablet TAKE 1 TABLET AT BEDTIME 90 tablet 3   spironolactone (ALDACTONE) 25 MG tablet Take 1 tablet (25 mg total) by mouth daily. 90 tablet 3   tamsulosin (FLOMAX) 0.4 MG CAPS capsule TAKE 1 CAPSULE AT BEDTIME 90 capsule 1   warfarin (COUMADIN) 5 MG tablet TAKE 2 TABLETS EVERY DAY  OR AS DIRECTED 180 tablet 1   No current facility-administered medications for this visit.   Allergies:  Iohexol, Aspirin, and Contrast media [iodinated contrast media]   Social History: The patient  reports that he has been smoking cigarettes. He has never used smokeless tobacco. He reports current drug use. Drug: Marijuana. He reports that he does not drink alcohol.   Family History: The patient's family history includes Alcoholism in his father; Diabetes in his mother; Hypertension in his mother.   ROS:  Please see the history of present illness. Otherwise, complete review of systems is positive for none.  All other systems are reviewed and negative.  Physical Exam: VS:  BP (!) 146/92   Pulse (!) 46   Ht 6\' 1"  (1.854 m)   Wt 254 lb 3.2 oz (115.3 kg)   SpO2 97%   BMI 33.54 kg/m , BMI Body mass index is 33.54 kg/m.  Wt Readings from Last 3 Encounters:  08/28/22 254 lb 3.2 oz (115.3 kg)  05/02/22 236 lb (107 kg)  04/12/22 239 lb (108.4 kg)    GEN: Well nourished, well developed in no acute distress HEENT: Normal NECK: No JVD; No carotid bruits LYMPHATICS: No lymphadenopathy CARDIAC:RRR, no murmurs, rubs, gallops RESPIRATORY:  Clear to auscultation without rales, wheezing or rhonchi  ABDOMEN: Soft, non-tender, non-distended MUSCULOSKELETAL:  No  edema; No deformity  SKIN: Warm and dry NEUROLOGIC:  Alert and oriented x 3 PSYCHIATRIC:  Normal affect    ECG: EKG was performed in the office today and demonstrates sinus bradycardia with nonspecific IVCD with LVH and repol abnormality  Recent Labwork: 04/12/2022: ALT 10; AST 15; BUN 17; Creatinine, Ser 1.22; Hemoglobin 12.1; Platelets 192; Potassium 3.6; Sodium 137     Component Value Date/Time   CHOL 180 04/29/2018 1032   TRIG 67 04/29/2018 1032   HDL 72 04/29/2018 1032   CHOLHDL 2.5 04/29/2018 1032   VLDL 13 04/29/2018 1032   LDLCALC 95 04/29/2018 1032    Other Studies Reviewed Today:  2D echo 07-Feb-2022 IMPRESSIONS    1. Limited study.   2. Left ventricular ejection fraction, by estimation, is 30 to 35%. The  left ventricle has moderately decreased function. The left ventricular  internal cavity size was mildly dilated. There is severe concentric left  ventricular hypertrophy.   3. The mitral valve is abnormal, mildly thickened. Redundant and mobile  chordal structures in submitral apparatus noted without obvious leaflet  incompetence (no color Doppler performed).   4. There is a 25 mm St. Jude mechanical valve present in the aortic  position.   5. The inferior vena cava is normal in size with greater than 50%  respiratory variability, suggesting right atrial pressure of 3 mmHg.   Comparison(s): Prior images reviewed side by side. No substantial change  in LVEF.    Assessment and Plan:  1. Severe AR -s/p mechanical AVR -2D echo 09/2021 showed stable 25 mm St Jude mechanical AVR with mean aortic valve gradient 15 mmHg and mild perivalvular AI. -Continue prescription drug management with aspirin 81 mg daily and Coumadin  -He denies any bleeding problems and INR is followed in Coumadin clinic  2. Essential hypertension -BP is elevated today -he has runout of Farxiga, Carvedilol and Entresto and likely the reason why BP is elevated -Continue prescription drug  management with carvedilol 12.5 mg twice daily, Entresto 97-103 mg twice daily, spironolactone 25 mg daily >>I have refilled heart meds today -I have personally reviewed and interpreted outside labs performed by patient's PCP which showed serum creatinine 1.22 and potassium 3.6 on 04/12/2022  -Repeat bmet in 1 week after getting back on meds  3. Mixed hyperlipidemia -LDL goal < 70 -check FLP and ALT in 1 week -Continue prescription drug management with simvastatin 40 mg daily with as needed refills  4. Cardiomyopathy, unspecified type (HCC)/Chronic combined systolic/diastolic CHF -2D echo 8676 with EF 45% and repeat 2D echo 10/09/2021 showed a further reduction in EF to 30 to 35% with global hypokinesis and repeat limited echo 12/2021 showed persistence of LV dysfunction with no improvement in EF   -Nuclear stress test 10/10/2021 showed no ischemia and fixed defect in  the inferior wall consistent with prior infarct -He had minimal CAD by cath in 2003  -Coronary CTA was recommended at last office visit but he did not follow through>>will reorder -He does not appear volume overloaded on exam today -Continue prescription drug therapy with carvedilol 12.5 mg twice daily, Farxiga 10 mg daily, Entresto 97-103 mg twice daily and spironolactone 25 mg daily>>he ran out of his HF meds so will reorder all of them (he is not sure if he ran out of his carvedilol or if he is still taking it) -Cannot increase carvedilol further due to bradycardia>>It is unclear today if he is still taking his Carvedilol as he ran out of some of his med but he cannot tell me which ones-he is bradycardic with a HR in the 40's so will decrease dose of Carvedilol to 6.25mg  BID and repeat EKG in 1 week -Due to being on maximum tolerated GDMT with no improvement in EF was seen by EP and offered ICD for primary prevention but he was not willing to agree to it at that time and wanted to think about it and he would call if he decided he  wanted it  5.  ASCAD -Minimal plaque in the proximal LAD in 2001 in the setting of severe LV dysfunction at that time with EF 25 to 30% -Recent nuclear stress test showed no ischemia with fixed defect in the inferior wall consistent with prior infarct -As above he did not follow through with coronary CTA>>will reorder -He has not had any anginal symptoms -Continue aspirin 81 mg daily, beta-blocker and statin   Medication Adjustments/Labs and Tests Ordered: Current medicines are reviewed at length with the patient today.  Concerns regarding medicines are outlined above.   Disposition: Follow-up with Dr. Wyline Mood or APP 1 year  Signed, Rennis Harding, NP 08/28/2022 1:08 PM    [facility] at [facility] [address] [place], [place], [place] [address] Phone: [phone]; Fax: [phone]

## 2022-08-28 NOTE — Addendum Note (Signed)
Addended by: Christella Scheuermann C on: 08/28/2022 04:09 PM   Modules accepted: Orders

## 2022-08-28 NOTE — Patient Instructions (Addendum)
Medication Instructions:  Your physician has recommended you make the following change in your medication:  -Decrease Coreg to 6.25 mg tablets twice daily   Labwork: In 1 Week: -CMET -FSTING LIPID PANEL- NOTHING TO EAT/DRINK 6 HOURS PRIOR  Testing/Procedures: CORONARY CTA  Follow-Up: Follow up with Dr. Radford Pax in 3 months  Any Other Special Instructions Will Be Listed Below (If Applicable).     If you need a refill on your cardiac medications before your next appointment, please call your pharmacy.

## 2022-08-28 NOTE — Addendum Note (Signed)
Addended by: Christella Scheuermann C on: 08/28/2022 01:50 PM   Modules accepted: Orders

## 2022-08-28 NOTE — Addendum Note (Signed)
Addended by: Christella Scheuermann C on: 08/28/2022 01:26 PM   Modules accepted: Orders

## 2022-09-04 ENCOUNTER — Ambulatory Visit: Payer: Medicare Other | Attending: Cardiovascular Disease | Admitting: *Deleted

## 2022-09-04 VITALS — HR 65

## 2022-09-04 DIAGNOSIS — I10 Essential (primary) hypertension: Secondary | ICD-10-CM

## 2022-09-04 NOTE — Progress Notes (Signed)
Pt in office for EKG after medication change.

## 2022-09-05 IMAGING — DX DG CHEST 1V PORT
1 series · 1 of 1 positions shown · non-contrast
Comparison: 0996

CLINICAL DATA: Chest pain

EXAM:
PORTABLE CHEST 1 VIEW

[chest ap]
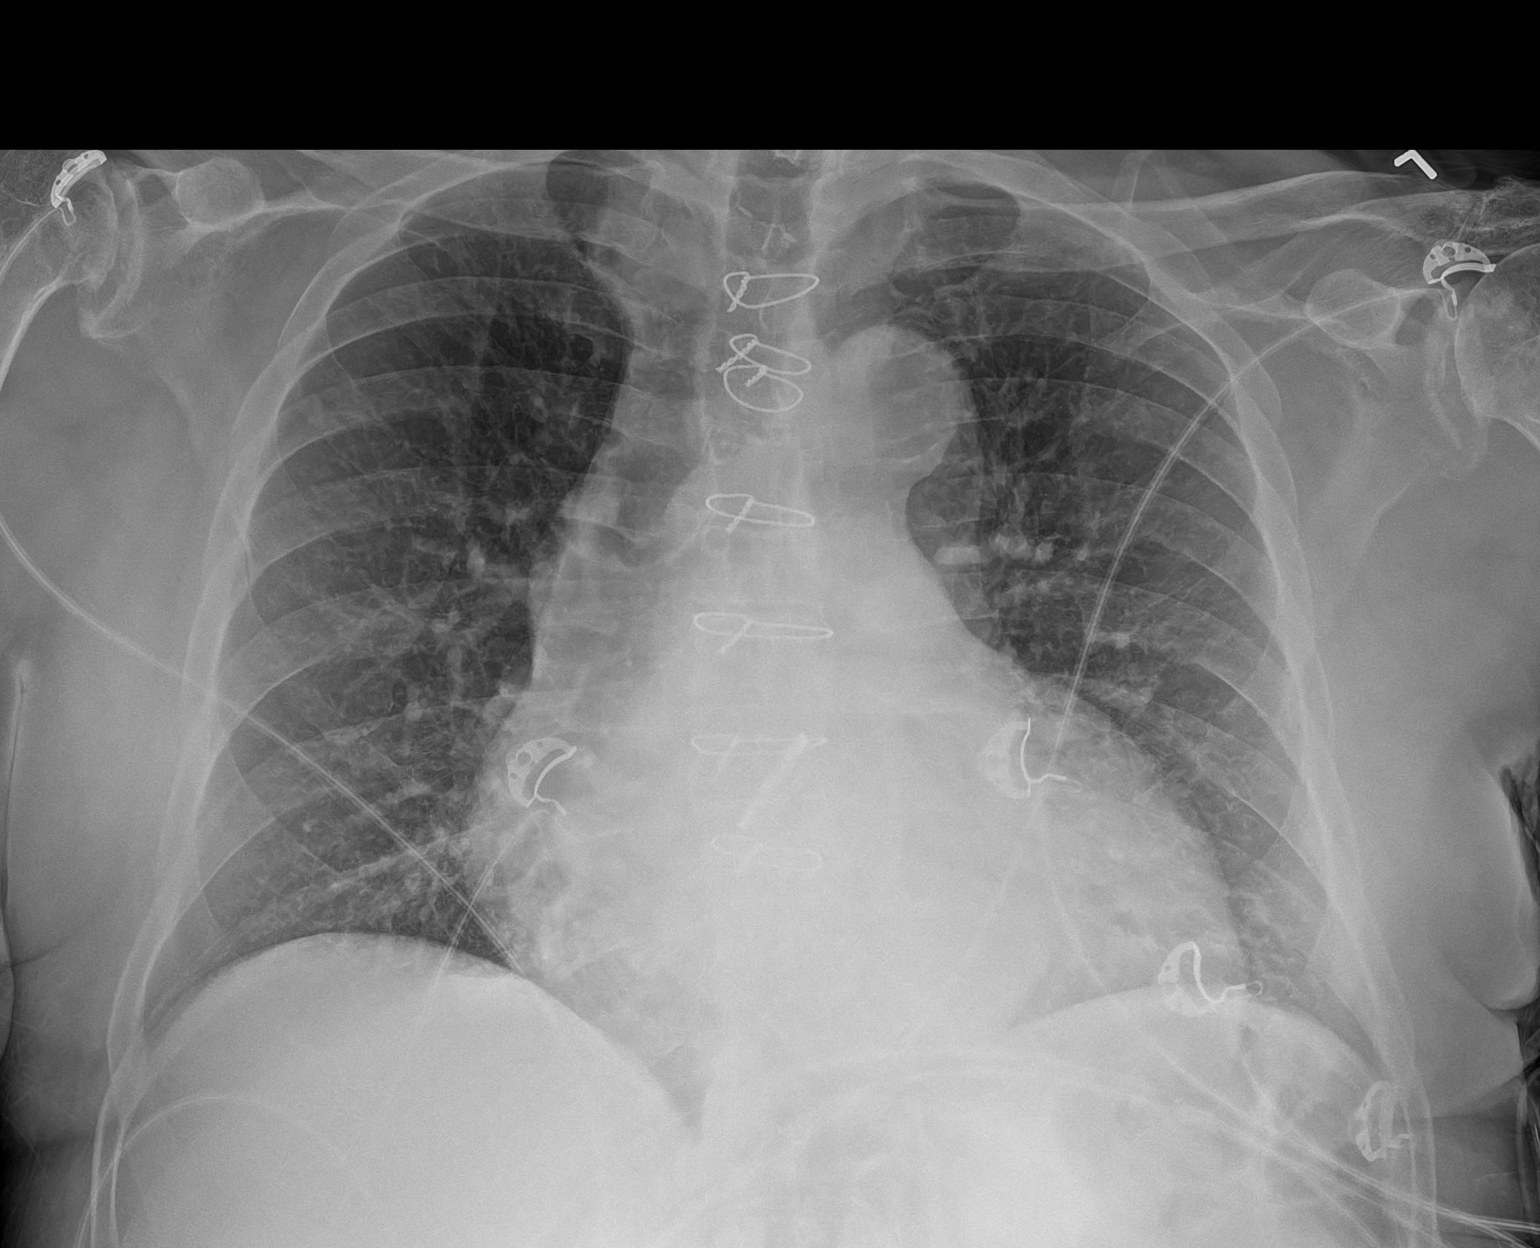

[1 of 1 positions shown; findings below may reference images not displayed]

FINDINGS: Cardiomegaly. No pleural effusion or pneumothorax. No consolidation
or edema.
IMPRESSION: No acute process in the chest.  Cardiomegaly.

## 2022-09-08 ENCOUNTER — Telehealth (HOSPITAL_COMMUNITY): Payer: Self-pay | Admitting: *Deleted

## 2022-09-08 NOTE — Telephone Encounter (Signed)
Attempted to call patient regarding upcoming cardiac CT appointment. °Left message on voicemail with name and callback number ° °Evella Kasal RN Navigator Cardiac Imaging °[facility] °[phone] Office °[phone] Cell ° °

## 2022-09-11 ENCOUNTER — Other Ambulatory Visit (HOSPITAL_COMMUNITY): Payer: Self-pay | Admitting: *Deleted

## 2022-09-11 ENCOUNTER — Ambulatory Visit (HOSPITAL_COMMUNITY): Admission: RE | Admit: 2022-09-11 | Payer: Medicare Other | Source: Ambulatory Visit

## 2022-09-11 ENCOUNTER — Telehealth (HOSPITAL_COMMUNITY): Payer: Self-pay | Admitting: *Deleted

## 2022-09-11 MED ORDER — PREDNISONE 50 MG PO TABS: ORAL_TABLET | ORAL | 0 refills | Status: DC

## 2022-09-11 MED ORDER — DIPHENHYDRAMINE HCL 50 MG PO TABS: ORAL_TABLET | ORAL | 0 refills | Status: DC

## 2022-09-11 NOTE — Telephone Encounter (Signed)
Calling to follow up on patient canceling his test due to not having his pills.  Left message for patient to call back.  Gordy Clement RN Navigator Cardiac Imaging [facility] [phone] Office [phone] Cell

## 2022-09-13 ENCOUNTER — Ambulatory Visit: Payer: Medicare Other | Attending: Cardiology | Admitting: *Deleted

## 2022-09-13 DIAGNOSIS — Z5181 Encounter for therapeutic drug level monitoring: Secondary | ICD-10-CM | POA: Diagnosis not present

## 2022-09-13 DIAGNOSIS — Z952 Presence of prosthetic heart valve: Secondary | ICD-10-CM | POA: Diagnosis not present

## 2022-09-13 LAB — POCT INR: INR: 6 — AB (ref 2.0–3.0)

## 2022-09-13 NOTE — Patient Instructions (Signed)
Hold warfarin warfarin x 3 days the resume 2 tablets daily  Recheck in 1 week Keep using pill box for warfarin

## 2022-09-18 ENCOUNTER — Ambulatory Visit: Payer: Medicare Other | Attending: Cardiology | Admitting: *Deleted

## 2022-09-18 DIAGNOSIS — Z5181 Encounter for therapeutic drug level monitoring: Secondary | ICD-10-CM

## 2022-09-18 DIAGNOSIS — Z952 Presence of prosthetic heart valve: Secondary | ICD-10-CM

## 2022-09-18 LAB — POCT INR: INR: 1.7 — AB (ref 2.0–3.0)

## 2022-09-18 NOTE — Patient Instructions (Signed)
Continue warfarin 2 tablets daily  Recheck in 2 wks Keep using pill box for warfarin

## 2022-09-21 ENCOUNTER — Other Ambulatory Visit (HOSPITAL_COMMUNITY): Payer: Medicare Other

## 2022-09-22 ENCOUNTER — Ambulatory Visit (HOSPITAL_COMMUNITY): Admission: RE | Admit: 2022-09-22 | Payer: Medicare Other | Source: Ambulatory Visit

## 2022-09-25 ENCOUNTER — Telehealth (HOSPITAL_COMMUNITY): Payer: Self-pay | Admitting: Emergency Medicine

## 2022-09-25 NOTE — Telephone Encounter (Signed)
Reaching out to patient to offer assistance regarding upcoming cardiac imaging study; pt verbalizes understanding of appt date/time, parking situation and where to check in, pre-test NPO status and medications ordered, and verified current allergies; name and call back number provided for further questions should they arise [name] Bond RN Navigator Cardiac Imaging [facility] [phone] office [phone] cell  Arrival 100, WC entrance Reviewed times to take 13 hr prep ([phone]) Deneis iv issues

## 2022-09-26 ENCOUNTER — Other Ambulatory Visit: Payer: Self-pay | Admitting: Cardiovascular Disease

## 2022-09-26 ENCOUNTER — Ambulatory Visit (HOSPITAL_COMMUNITY)
Admission: RE | Admit: 2022-09-26 | Discharge: 2022-09-26 | Disposition: A | Discharge status: Home / Self Care | Payer: Medicare Other | Source: Ambulatory Visit | Attending: Cardiology | Admitting: Cardiology

## 2022-09-26 ENCOUNTER — Ambulatory Visit (HOSPITAL_BASED_OUTPATIENT_CLINIC_OR_DEPARTMENT_OTHER)
Admission: RE | Admit: 2022-09-26 | Discharge: 2022-09-26 | Disposition: A | Discharge status: Home / Self Care | Payer: Medicare Other | Source: Ambulatory Visit | Attending: Cardiovascular Disease | Admitting: Cardiovascular Disease

## 2022-09-26 DIAGNOSIS — I428 Other cardiomyopathies: Secondary | ICD-10-CM | POA: Diagnosis present

## 2022-09-26 DIAGNOSIS — R931 Abnormal findings on diagnostic imaging of heart and coronary circulation: Secondary | ICD-10-CM

## 2022-09-26 DIAGNOSIS — I2510 Atherosclerotic heart disease of native coronary artery without angina pectoris: Secondary | ICD-10-CM

## 2022-09-26 MED ORDER — NITROGLYCERIN 0.4 MG SL SUBL
SUBLINGUAL_TABLET | SUBLINGUAL | Status: AC
  Filled 2022-09-26: qty 2

## 2022-09-26 MED ORDER — IOHEXOL 350 MG/ML SOLN
100.0000 mL | Freq: Once | INTRAVENOUS | Status: AC | PRN
  Administered 2022-09-26: 100 mL via INTRAVENOUS

## 2022-09-26 MED ORDER — NITROGLYCERIN 0.4 MG SL SUBL
0.8000 mg | SUBLINGUAL_TABLET | Freq: Once | SUBLINGUAL | Status: AC
  Administered 2022-09-26: 0.8 mg via SUBLINGUAL

## 2022-09-28 ENCOUNTER — Encounter: Payer: Self-pay | Admitting: Cardiology

## 2022-10-02 ENCOUNTER — Ambulatory Visit: Payer: Medicare Other | Attending: Cardiology | Admitting: *Deleted

## 2022-10-02 DIAGNOSIS — Z5181 Encounter for therapeutic drug level monitoring: Secondary | ICD-10-CM

## 2022-10-02 DIAGNOSIS — Z952 Presence of prosthetic heart valve: Secondary | ICD-10-CM | POA: Diagnosis not present

## 2022-10-02 LAB — POCT INR: INR: 4.2 — AB (ref 2.0–3.0)

## 2022-10-02 NOTE — Patient Instructions (Signed)
Hold warfarin tonight then decrease dose to 2 tablets daily except 1 tablet on Mondays Recheck in 3 wks Keep using pill box for warfarin

## 2022-10-03 ENCOUNTER — Telehealth: Payer: Self-pay

## 2022-10-03 DIAGNOSIS — R931 Abnormal findings on diagnostic imaging of heart and coronary circulation: Secondary | ICD-10-CM

## 2022-10-03 DIAGNOSIS — E7800 Pure hypercholesterolemia, unspecified: Secondary | ICD-10-CM

## 2022-10-03 NOTE — Telephone Encounter (Signed)
  [name] Reichert, MD 09/28/2022  3:36 PM EST     Please get gated chest CTA to further assess the distal portion of the aortic graft        Coronary CTA showed a elevated coronary calcium score 87 which is 95th percentile for age and sex matched controls.  There was 25-49% mid LAD, <25% oD2, 25 to 49% OM 2, 50 to 69% PLB at takeoff of PDA 25 to 49% PDA and 50 to 69% small nondominant RCA stable mechanical AVR.  Intact a sending aortic graft post Bentall but distal to the graft appears dilated but not adequate scan for assessment.  Please have patient come in for fasting lipid panel and ALT.he had a recent nuclear stress test that showed no ischemia and a fixed defect in the inferior wall consistent with prior infarct.  FFR pending

## 2022-10-03 NOTE — Telephone Encounter (Signed)
Orders placed. Left message for patient to call office regarding results.

## 2022-10-05 ENCOUNTER — Other Ambulatory Visit (HOSPITAL_COMMUNITY)
Admission: RE | Admit: 2022-10-05 | Discharge: 2022-10-05 | Disposition: A | Discharge status: Home / Self Care | Payer: Medicare Other | Source: Ambulatory Visit | Attending: Cardiology | Admitting: Cardiology

## 2022-10-05 DIAGNOSIS — E7800 Pure hypercholesterolemia, unspecified: Secondary | ICD-10-CM | POA: Insufficient documentation

## 2022-10-05 LAB — COMPREHENSIVE METABOLIC PANEL
ALT: 15 U/L (ref 0–44)
AST: 17 U/L (ref 15–41)
Albumin: 3.8 g/dL (ref 3.5–5.0)
Alkaline Phosphatase: 65 U/L (ref 38–126)
Anion gap: 5 (ref 5–15)
BUN: 22 mg/dL (ref 8–23)
CO2: 23 mmol/L (ref 22–32)
Calcium: 8.6 mg/dL — ABNORMAL LOW (ref 8.9–10.3)
Chloride: 111 mmol/L (ref 98–111)
Creatinine, Ser: 1.29 mg/dL — ABNORMAL HIGH (ref 0.61–1.24)
GFR, Estimated: >60 mL/min (ref >60)
Glucose, Bld: 95 mg/dL (ref 70–99)
Potassium: 4.3 mmol/L (ref 3.5–5.1)
Sodium: 139 mmol/L (ref 135–145)
Total Bilirubin: 0.9 mg/dL (ref 0.3–1.2)
Total Protein: 7.4 g/dL (ref 6.5–8.1)

## 2022-10-05 LAB — LIPID PANEL
Cholesterol: 195 mg/dL (ref 0–200)
HDL: 57 mg/dL (ref >40)
LDL Cholesterol: 124 mg/dL — ABNORMAL HIGH (ref 0–99)
Total CHOL/HDL Ratio: 3.4 RATIO
Triglycerides: 69 mg/dL (ref <150)
VLDL: 14 mg/dL (ref 0–40)

## 2022-10-09 ENCOUNTER — Telehealth: Payer: Self-pay | Admitting: *Deleted

## 2022-10-09 ENCOUNTER — Encounter: Payer: Self-pay | Admitting: *Deleted

## 2022-10-09 DIAGNOSIS — E7800 Pure hypercholesterolemia, unspecified: Secondary | ICD-10-CM

## 2022-10-09 MED ORDER — ATORVASTATIN CALCIUM 80 MG PO TABS: 80.0000 mg | ORAL_TABLET | Freq: Every day | ORAL | 3 refills | Status: DC

## 2022-10-09 NOTE — Telephone Encounter (Signed)
Pt notified of lab results, medication changes and the need for labs in Jan.

## 2022-10-23 ENCOUNTER — Ambulatory Visit: Payer: Medicare Other | Attending: Cardiology | Admitting: *Deleted

## 2022-10-23 DIAGNOSIS — Z5181 Encounter for therapeutic drug level monitoring: Secondary | ICD-10-CM

## 2022-10-23 DIAGNOSIS — Z952 Presence of prosthetic heart valve: Secondary | ICD-10-CM | POA: Diagnosis not present

## 2022-10-23 LAB — POCT INR: INR: 6.5 — AB (ref 2.0–3.0)

## 2022-10-23 NOTE — Patient Instructions (Signed)
Hold warfarin tonight and tomorrow  then decrease dose to 2 tablets daily except 1 tablet on Mondays, Wednesdays and Fridays Recheck in 2 wks Keep using pill box for warfarin Bleeding and fall precautions discussed with pt and he verbalized understanding.

## 2022-11-06 ENCOUNTER — Ambulatory Visit: Payer: Medicare Other | Attending: Cardiology | Admitting: *Deleted

## 2022-11-06 DIAGNOSIS — Z5181 Encounter for therapeutic drug level monitoring: Secondary | ICD-10-CM

## 2022-11-06 DIAGNOSIS — Z952 Presence of prosthetic heart valve: Secondary | ICD-10-CM | POA: Diagnosis not present

## 2022-11-06 LAB — POCT INR: INR: 3.3 — AB (ref 2.0–3.0)

## 2022-11-06 NOTE — Patient Instructions (Signed)
Continue warfarin 2 tablets daily except 1 tablet on Mondays, Wednesdays and Fridays Recheck in 3 wks Keep using pill box for warfarin

## 2022-11-27 ENCOUNTER — Ambulatory Visit: Payer: Medicare HMO | Attending: Cardiology | Admitting: *Deleted

## 2022-11-27 DIAGNOSIS — Z952 Presence of prosthetic heart valve: Secondary | ICD-10-CM | POA: Diagnosis not present

## 2022-11-27 DIAGNOSIS — Z5181 Encounter for therapeutic drug level monitoring: Secondary | ICD-10-CM | POA: Diagnosis not present

## 2022-11-27 LAB — POCT INR: INR: 2.5 (ref 2.0–3.0)

## 2022-11-27 NOTE — Progress Notes (Unsigned)
Cardiology Clinic Note   Patient Name: [name] Date of Encounter: 11/27/2022  Primary Care Provider:  Default, Provider, MD Primary Cardiologist:  Fransico Him, MD  Patient Profile    72 year old male with a history of AVR, hypertension, hyperlipidemia, nonischemic cardiomyopathy, HFrEF, coronary artery disease, who is here today for follow-up.  Past Medical History    Past Medical History:  Diagnosis Date   Aortic insufficiency 2003   S/P St Jude mechanicl AVR on chronic anticoagulation with warfarin   CAD (coronary artery disease), native coronary artery 2003   25-49% mid LAD, <25% oD2, 25 to 49% OM 2, 50 to 69% PLB at takeoff of PDA 25 to 49% PDA and 50 to 69% small nondominant RCA coronary CTA 09/2022   HLD (hyperlipidemia)    Hypertension    NICM (nonischemic cardiomyopathy) (HCC)    EF 20-25% at time of AVR and improved to 45% by echo 2017 and now  30-35% by echo 09/2021   Poor historian    Sexual dysfunction    Past Surgical History:  Procedure Laterality Date   CARDIAC CATHETERIZATION  2001   CIRCUMCISION  11/20/2004   [facility]--which was uncomplicated   EXPLORATION POST OPERATIVE OPEN HEART     open heart surgery    Allergies  Allergies  Allergen Reactions   Iohexol Hives, Itching and Swelling    Extremity swelling and hives   Aspirin     On coumadin   Contrast Media [Iodinated Contrast Media]     History of Present Illness    [name] is a 72 year old male with previously mentioned past medical history AVR with a Saint Jude mechanical valve done in 2003 due to severe aortic regurgitation with a 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  He had an NST in November 2022 due to a worsening LVEF on echocardiogram revealing  30-35% from 45% prior.  Stress testing revealed no ischemia.  He had previously had his spironolactone increased to 25 mg daily and Entresto was increased to 97/23 mg twice daily for the escalation of GDMT.  Repeat echocardiogram showed persistence of LV dysfunction with an EF of 30-35%.  He was last seen in clinic 08/28/2022 and was doing well had denied any chest pain or shortness of breath or symptoms of decompensation.  He had also been compliant and tolerating all of his medications without side effects.  Due to his persistent decreased LVEF on maximally tolerated GDMT without improvement he was evaluated by EP and offered ICD for primary prevention but declined at the time stating he wanted to think about it and call if he decided to undergo the procedure.  He returns to clinic today  Home Medications    Current Outpatient Medications  Medication Sig Dispense Refill   aspirin EC 81 MG tablet Take 1 tablet (81 mg total) by mouth daily. Swallow whole. 90 tablet 3   atorvastatin (LIPITOR) 80 MG tablet Take 1 tablet (80 mg total) by mouth daily. 90 tablet 3   carvedilol (COREG) 6.25 MG tablet Take 1 tablet (6.25 mg total) by mouth 2 (two) times daily. 180 tablet 3   dapagliflozin propanediol (FARXIGA) 10 MG TABS tablet Take 1 tablet (10 mg total) by mouth daily before breakfast. 90 tablet 3   diphenhydrAMINE (BENADRYL) 50 MG tablet Take tablet 1  hours prior to your cardiac CT scan. 1 tablet 0   predniSONE (DELTASONE) 50 MG tablet Take 1 tablet 13 hours prior to CT scan Take 1 tablet 7 hours prior to CT scan Take 1 tablet 1 hour prior to CT scan 3 tablet 0   sacubitril-valsartan (ENTRESTO) 97-103 MG Take 1 tablet by mouth 2 (two) times daily. 60 tablet 11   spironolactone (ALDACTONE) 25 MG tablet Take 1 tablet (25 mg total) by mouth daily. 90 tablet 3   tamsulosin (FLOMAX) 0.4 MG CAPS capsule TAKE 1 CAPSULE AT BEDTIME 90 capsule 1   warfarin (COUMADIN) 5 MG tablet TAKE 2 TABLETS EVERY DAY  OR AS  DIRECTED 180 tablet 1   No current facility-administered medications for this visit.     Family History    Family History  Problem Relation Age of Onset   Hypertension Mother    Diabetes Mother    Alcoholism Father    He indicated that his mother is deceased. He indicated that his father is deceased. He indicated that his sister is alive. He indicated that his brother is alive.  Social History    Social History   Socioeconomic History   Marital status: Divorced    Spouse name: Not on file   Number of children: Not on file   Years of education: Not on file   Highest education level: Not on file  Occupational History   Not on file  Tobacco Use   Smoking status: Some Days    Types: Cigarettes   Smokeless tobacco: Never  Vaping Use   Vaping Use: Never used  Substance and Sexual Activity   Alcohol use: No   Drug use: Yes    Types: Marijuana    Comment: last use 1 month ago   Sexual activity: Yes    Birth control/protection: None  Other Topics Concern   Not on file  Social History Narrative   Not on file   Social Determinants of Health   Financial Resource Strain: Not on file  Food Insecurity: Not on file  Transportation Needs: Not on file  Physical Activity: Not on file  Stress: Not on file  Social Connections: Not on file  Intimate Partner Violence: Not on file     Review of Systems    General:  No chills, fever, night sweats or weight changes.  Cardiovascular:  No chest pain, dyspnea on exertion, edema, orthopnea, palpitations, paroxysmal nocturnal dyspnea. Dermatological: No rash, lesions/masses Respiratory: No cough, dyspnea Urologic: No hematuria, dysuria Abdominal:   No nausea, vomiting, diarrhea, bright red blood per rectum, melena, or hematemesis Neurologic:  No visual changes, wkns, changes in mental status. All other systems reviewed and are otherwise negative except as noted above.     Physical Exam    VS:  There were no vitals taken for  this visit. , BMI There is no height or weight on file to calculate BMI.     GEN: Well nourished, well developed, in no acute distress. HEENT: normal. Neck: Supple, no JVD, carotid bruits, or masses. Cardiac: RRR, no murmurs, rubs, or gallops. No clubbing, cyanosis, edema.  Radials 2+/PT 2+ and equal bilaterally.  Respiratory:  Respirations regular and unlabored, clear to auscultation bilaterally. GI: Soft, nontender, nondistended, BS + x 4. MS: no deformity or atrophy. Skin: warm and dry, no rash. Neuro:  Strength and sensation are intact. Psych: Normal affect.  Accessory Clinical Findings    ECG personally reviewed by me today- *** - No acute changes  Lab Results  Component Value Date   WBC 3.7 (L) 04/12/2022   HGB 12.1 (L) 04/12/2022   HCT 37.5 (L) 04/12/2022   MCV 78.6 (L) 04/12/2022   PLT 192 04/12/2022   Lab Results  Component Value Date   CREATININE 1.29 (H) 10/05/2022   BUN 22 10/05/2022   NA 139 10/05/2022   K 4.3 10/05/2022   CL 111 10/05/2022   CO2 23 10/05/2022   Lab Results  Component Value Date   ALT 15 10/05/2022   AST 17 10/05/2022   ALKPHOS 65 10/05/2022   BILITOT 0.9 10/05/2022   Lab Results  Component Value Date   CHOL 195 10/05/2022   HDL 57 10/05/2022   LDLCALC 124 (H) 10/05/2022   TRIG 69 10/05/2022   CHOLHDL 3.4 10/05/2022    No results found for: "HGBA1C"  Assessment & Plan   1.  ***  Wilbert Hayashi, NP 11/27/2022, 8:42 PM

## 2022-11-27 NOTE — Patient Instructions (Signed)
Continue warfarin 2 tablets daily except 1 tablet on Mondays, Wednesdays and Fridays Recheck in 4 wks Keep using pill box for warfarin 

## 2022-11-29 ENCOUNTER — Encounter: Payer: Self-pay | Admitting: Physician Assistant

## 2022-11-29 ENCOUNTER — Ambulatory Visit: Payer: Medicare HMO | Attending: Physician Assistant | Admitting: Cardiology

## 2022-11-29 VITALS — BP 146/80 | HR 54 | Ht 73.0 in | Wt 243.6 lb

## 2022-11-29 DIAGNOSIS — E782 Mixed hyperlipidemia: Secondary | ICD-10-CM | POA: Diagnosis not present

## 2022-11-29 DIAGNOSIS — I10 Essential (primary) hypertension: Secondary | ICD-10-CM

## 2022-11-29 DIAGNOSIS — I5022 Chronic systolic (congestive) heart failure: Secondary | ICD-10-CM

## 2022-11-29 DIAGNOSIS — I428 Other cardiomyopathies: Secondary | ICD-10-CM

## 2022-11-29 DIAGNOSIS — Z952 Presence of prosthetic heart valve: Secondary | ICD-10-CM | POA: Diagnosis not present

## 2022-11-29 DIAGNOSIS — Z79899 Other long term (current) drug therapy: Secondary | ICD-10-CM

## 2022-11-29 DIAGNOSIS — I351 Nonrheumatic aortic (valve) insufficiency: Secondary | ICD-10-CM

## 2022-11-29 DIAGNOSIS — I2510 Atherosclerotic heart disease of native coronary artery without angina pectoris: Secondary | ICD-10-CM

## 2022-11-29 MED ORDER — EZETIMIBE 10 MG PO TABS: 10.0000 mg | ORAL_TABLET | Freq: Every day | ORAL | 3 refills | Status: DC

## 2022-11-29 NOTE — Patient Instructions (Signed)
Medication Instructions:  Start Zetia 10 mg Daily   Labwork: Your physician recommends that you return for lab work in: [place] ( February 07, 2023)    Testing/Procedures: NONE   Follow-Up: Your physician recommends that you schedule a follow-up appointment in: 10-12 Weeks    Any Other Special Instructions Will Be Listed Below (If Applicable).     If you need a refill on your cardiac medications before your next appointment, please call your pharmacy. Thank you for choosing [facility]!

## 2022-12-25 ENCOUNTER — Ambulatory Visit: Payer: Medicare HMO | Attending: Cardiology | Admitting: *Deleted

## 2022-12-25 DIAGNOSIS — Z5181 Encounter for therapeutic drug level monitoring: Secondary | ICD-10-CM

## 2022-12-25 DIAGNOSIS — Z952 Presence of prosthetic heart valve: Secondary | ICD-10-CM

## 2022-12-25 LAB — POCT INR: INR: 3.5 — AB (ref 2.0–3.0)

## 2022-12-25 NOTE — Patient Instructions (Signed)
Continue warfarin 2 tablets daily except 1 tablet on Mondays, Wednesdays and Fridays Recheck in 4 wks Keep using pill box for warfarin

## 2022-12-28 ENCOUNTER — Telehealth: Payer: Self-pay | Admitting: Cardiology

## 2022-12-28 MED ORDER — WARFARIN SODIUM 5 MG PO TABS: ORAL_TABLET | ORAL | 1 refills | Status: DC

## 2022-12-28 NOTE — Telephone Encounter (Signed)
Refill request for warfarin:  Last INR was 3.5 on 12/25/22 Next INR due 01/22/23 LOV 11/29/22  S Hammock NP  Refill approved.

## 2022-12-28 NOTE — Telephone Encounter (Signed)
Patient is out of Warfarin and needs refills mentioned he wanted 3m if possible.

## 2023-01-22 ENCOUNTER — Ambulatory Visit: Payer: Medicare HMO | Attending: Cardiology | Admitting: *Deleted

## 2023-01-22 DIAGNOSIS — Z952 Presence of prosthetic heart valve: Secondary | ICD-10-CM | POA: Diagnosis not present

## 2023-01-22 DIAGNOSIS — Z5181 Encounter for therapeutic drug level monitoring: Secondary | ICD-10-CM | POA: Diagnosis not present

## 2023-01-22 LAB — POCT INR: INR: 5.9 — AB (ref 2.0–3.0)

## 2023-01-22 NOTE — Patient Instructions (Addendum)
Hold warfarin tonight and tomorrow night then continue 2 tablets daily except 1 tablet on Mondays, Wednesdays and Fridays Recheck in 1 wks Keep using pill box for warfarin Denies S/S of excessive bruising or bleeding. Bleeding and fall precautions discussed with pt and he verbalized understanding.

## 2023-01-31 ENCOUNTER — Ambulatory Visit: Payer: Medicare HMO | Attending: Cardiology | Admitting: *Deleted

## 2023-01-31 DIAGNOSIS — Z952 Presence of prosthetic heart valve: Secondary | ICD-10-CM

## 2023-01-31 DIAGNOSIS — Z5181 Encounter for therapeutic drug level monitoring: Secondary | ICD-10-CM | POA: Diagnosis not present

## 2023-01-31 LAB — POCT INR: INR: 1.8 — AB (ref 2.0–3.0)

## 2023-01-31 NOTE — Patient Instructions (Signed)
Take warfarin 2 tablets tonight then continue 2 tablets daily except 1 tablet on Mondays, Wednesdays and Fridays Recheck in 1 wks Keep using pill box for warfarin

## 2023-02-15 ENCOUNTER — Ambulatory Visit: Payer: Medicare HMO | Admitting: Student

## 2023-02-22 ENCOUNTER — Ambulatory Visit: Payer: Medicare HMO | Attending: Cardiology | Admitting: *Deleted

## 2023-02-22 DIAGNOSIS — Z5181 Encounter for therapeutic drug level monitoring: Secondary | ICD-10-CM | POA: Diagnosis not present

## 2023-02-22 DIAGNOSIS — Z952 Presence of prosthetic heart valve: Secondary | ICD-10-CM

## 2023-02-22 LAB — POCT INR: INR: 1.9 — AB (ref 2.0–3.0)

## 2023-02-22 NOTE — Patient Instructions (Signed)
Take warfarin 2 tablets tonight and tomorrow night then increase dose to 2 tablets daily except 1 tablet on Mondays and Fridays Recheck in 2 wks Keep using pill box for warfarin

## 2023-03-08 ENCOUNTER — Ambulatory Visit: Payer: Medicare HMO | Attending: Cardiology | Admitting: *Deleted

## 2023-03-08 DIAGNOSIS — Z952 Presence of prosthetic heart valve: Secondary | ICD-10-CM

## 2023-03-08 DIAGNOSIS — Z5181 Encounter for therapeutic drug level monitoring: Secondary | ICD-10-CM

## 2023-03-08 LAB — POCT INR: INR: 1.7 — AB (ref 2.0–3.0)

## 2023-03-08 NOTE — Patient Instructions (Signed)
Take warfarin 3 tablets tonight then increase dose to 2 tablets daily Recheck in 10 days Keep using pill box for warfarin

## 2023-03-20 ENCOUNTER — Ambulatory Visit: Payer: Medicare HMO | Attending: Cardiology | Admitting: *Deleted

## 2023-03-20 DIAGNOSIS — Z5181 Encounter for therapeutic drug level monitoring: Secondary | ICD-10-CM

## 2023-03-20 DIAGNOSIS — Z952 Presence of prosthetic heart valve: Secondary | ICD-10-CM

## 2023-03-20 LAB — POCT INR: INR: 3.2 — AB (ref 2.0–3.0)

## 2023-03-20 NOTE — Patient Instructions (Addendum)
Continue warfarin 2 tablets daily Recheck in 3 wks per pt request  Keep using pill box for warfarin

## 2023-03-21 ENCOUNTER — Encounter: Payer: Self-pay | Admitting: Student

## 2023-03-21 ENCOUNTER — Ambulatory Visit: Payer: Medicare HMO | Attending: Student | Admitting: Student

## 2023-03-21 VITALS — BP 138/80 | HR 67 | Ht 73.0 in | Wt 214.0 lb

## 2023-03-21 DIAGNOSIS — I2510 Atherosclerotic heart disease of native coronary artery without angina pectoris: Secondary | ICD-10-CM

## 2023-03-21 DIAGNOSIS — Z79899 Other long term (current) drug therapy: Secondary | ICD-10-CM

## 2023-03-21 DIAGNOSIS — I10 Essential (primary) hypertension: Secondary | ICD-10-CM

## 2023-03-21 DIAGNOSIS — I5022 Chronic systolic (congestive) heart failure: Secondary | ICD-10-CM | POA: Diagnosis not present

## 2023-03-21 DIAGNOSIS — E785 Hyperlipidemia, unspecified: Secondary | ICD-10-CM

## 2023-03-21 DIAGNOSIS — I351 Nonrheumatic aortic (valve) insufficiency: Secondary | ICD-10-CM | POA: Diagnosis not present

## 2023-03-21 NOTE — Progress Notes (Signed)
Cardiology Office Note    Date:  03/21/2023  ID:  [name], DOB 02-04-1951, MRN [id number] Cardiologist: Armanda Magic, MD   EP: Dr. Ladona Ridgel  History of Present Illness:    [name] is a 72 y.o. male  with past medical history of severe AI (s/p St. Jude mechanical AVR in 2003 with Bentall procedure), CAD (s/p Coronary CT in 09/2022 showing moderate CAD but not significant by FFR), HTN, HLD and HFrEF (EF 45-50% in 06/2016, at 30-35% in 09/2021 and 01/2022) who presents to the office today for 15-month follow-up.  He was examined by [name], NP in 11/2022 and reported overall doing well at that time and denied any recent anginal symptoms. He had only been taking his Entresto once daily and it was recommended to increase this to twice daily dosing while continuing on Coreg, Jardiance and Spironolactone. He had previously been evaluated by EP and declined ICD placement. No additional changes were made to his medications at that time.  In talking with the patient today, he reports he has been under some increased stress as his teenage son recently moved back in with him. He is thankful to have him back in his life as he was unable to see him for several years. He denies any recent chest pain or dyspnea on exertion. No recent orthopnea, PND or pitting edema. 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   Studies Reviewed:   EKG: EKG is not ordered today.  Limited Echo: 01/2022 IMPRESSIONS     1. Limited study.   2. Left ventricular ejection fraction, by estimation, is 30 to 35%. The  left ventricle has moderately decreased function. The left ventricular  internal cavity size was mildly dilated. There is severe concentric left  ventricular hypertrophy.   3. The mitral valve is abnormal, mildly thickened. Redundant and mobile  chordal structures in submitral apparatus noted without obvious leaflet   incompetence (no color Doppler performed).   4. There is a 25 mm St. Jude mechanical valve present in the aortic  position.   5. The inferior vena cava is normal in size with greater than 50%  respiratory variability, suggesting right atrial pressure of 3 mmHg.   Comparison(s): Prior images reviewed side by side. No substantial change  in LVEF.    Coronary CT: 09/2022 Coronary Arteries: Left dominant with no anomalies   LM: Normal   LAD: 1-24% calcified plaque ostial and proximal, 25-49% mixed plaque in mid vessel 25-49% mixed plaque in distal vessel   D1: Normal   D2: 1-24% calcified plaque ostium   D3: Normal   Circumflex: 1-24% calcified plaque proximal 25-49% mixed plaque mid/distal   OM1: 1-24% calcified plaque in mid vessel   OM2: 25-49% mixed plaque   PLB: Heavily calcified 50-69% plaque at PDA take off   PDA 25-49% calcified plaque   RCA: Small non dominant 50-69% calcified plaque   Post Bental with 25 mm mechanical St Jude valve. No pannus or thrombus. Full motion not captured on prospective scan 35-75% phase so cannot comment   On opening angle Closing angle is normal at 30 degrees The aorta was not imaged past the distal graft suture line enough to measure but is dilated compared to the ascending aortic graft which measures 35 mm and is calcified   IMPRESSION: 1.  Calcium score 887 which is 95 th percentile for age/sex   2.  Post Bental with intact ascending graft Ascending aorta distal to graft appears dilated but not scanned high enough to evaluate   3. Normal appearing 25 mm mechanical St Jude bi leaflet valve with no thrombus / pannus and normal 30 degree closing angle   4. CAD RADS 3 coronary disease see description above study sent for FFR CT  FINDINGS: Non dominant Right 0.87   LAD: Proximal 0.94, mid 0.90 distal 0.83   D1: Proximal 0.94 distal 0.88   LCX Proximal 0.98 distal PLB 0.87   OM1 Proximal 0.97 distal 0.88    IMPRESSION: FFR CT negative for obstructive disease particularly in areas of concern in non dominant RCA and PLB/PDA ostium bifurcation    Physical Exam:   VS:  BP 138/80   Pulse 67   Ht 6\' 1"  (1.854 m)   Wt 214 lb (97.1 kg)   SpO2 98%   BMI 28.23 kg/m    Wt Readings from Last 3 Encounters:  03/21/23 214 lb (97.1 kg)  11/29/22 243 lb 9.6 oz (110.5 kg)  08/28/22 254 lb 3.2 oz (115.3 kg)     GEN: Well nourished, well developed male appearing in no acute distress NECK: No JVD; No carotid bruits CARDIAC: RRR, crisp mechanical valve sounds present. RESPIRATORY:  Clear to auscultation without rales, wheezing or rhonchi  ABDOMEN: Appears non-distended. No obvious abdominal masses. EXTREMITIES: No clubbing or cyanosis. No pitting edema.  Distal pedal pulses are 2+ bilaterally.   Assessment and Plan:   1. Chronic HFrEF - His EF was at 30 to 35% on most recent imaging in 09/2021 in 01/2022. He appears euvolemic by examination today and denies any specific orthopnea, PND or pitting edema. As discussed above, he is only been taking some of his medications once daily instead of twice daily. Compliance with his medications was encouraged and he will continue on Coreg 6.25 mg twice daily, Farxiga 10 mg daily, Entresto 97-103 mg twice daily and Spironolactone 25 mg daily.  Will recheck a BMET. Once he is compliant with medical therapy, could consider a follow-up echocardiogram later this year for reassessment. He has declined ICD placement in the past.  2. Severe AI - He is s/p St. Jude mechanical AVR in 2003 with Bentall procedure. Remains on Coumadin for anticoagulation and INR was at 3.2 when checked on 03/20/2023. No reports of active bleeding.  Will recheck a CBC with upcoming labs up as he does not have a PCP.  3. CAD  - Prior Coronary CT in 09/2022 showed moderate CAD but not significant by FFR as discussed above. He remains active at baseline and denies any recent anginal symptoms. -  Continue current medical therapy with Coreg 6.25 mg twice daily and Atorvastatin 80 mg daily.  He is not on ASA given the need for anticoagulation.  4. HTN - His blood pressure is at 138/80 during today's visit. As discussed above, he is unsure what medications he is taking as twice daily dosing but feels like he is only taking one of his medications twice daily. We highlighted the medications on his list that should be twice daily and compliance with this was encouraged. He will continue on Coreg 6.25 mg twice daily, Entresto 97-103 mg twice daily and Spironolactone 25 mg daily.  5. HLD - His LDL was elevated to 124 when checked in 09/2022 and he was switched from Simvastatin to Atorvastatin 80 mg daily. Also remains on Zetia 10 mg daily. He was supposed to have follow-up labs in 2  months but this has not been rechecked. Will reorder a repeat FLP and LFT's. He will come back for labs as he is not fasting today.    Signed, Ellsworth Lennox, PA-C

## 2023-03-21 NOTE — Patient Instructions (Signed)
Medication Instructions:  Your physician recommends that you continue on your current medications as directed. Please refer to the Current Medication list given to you today.   *If you need a refill on your cardiac medications before your next appointment, please call your pharmacy*   Lab Work: Your physician recommends that you return for lab work. Fasting ( Nothing to eat or drink after midnight)   If you have labs (blood work) drawn today and your tests are completely normal, you will receive your results only by: MyChart Message (if you have MyChart) OR A paper copy in the mail If you have any lab test that is abnormal or we need to change your treatment, we will call you to review the results.   Testing/Procedures: NONE    Follow-Up: At [facility], you and your health needs are our priority.  As part of our continuing mission to provide you with exceptional heart care, we have created designated Provider Care Teams.  These Care Teams include your primary Cardiologist (physician) and Advanced Practice Providers (APPs -  Physician Assistants and Nurse Practitioners) who all work together to provide you with the care you need, when you need it.  We recommend signing up for the patient portal called "MyChart".  Sign up information is provided on this After Visit Summary.  MyChart is used to connect with patients for Virtual Visits (Telemedicine).  Patients are able to view lab/test results, encounter notes, upcoming appointments, etc.  Non-urgent messages can be sent to your provider as well.   To learn more about what you can do with MyChart, go to [email or web address].    Your next appointment:   6 month(s)  Provider:   You may see Armanda Magic, MD or one of the following Advanced Practice Providers on your designated Care Team:   Randall An, PA-C  Jacolyn Reedy, PA-C     Other Instructions Thank you for choosing [facility]!

## 2023-04-03 ENCOUNTER — Other Ambulatory Visit (HOSPITAL_COMMUNITY)
Admission: RE | Admit: 2023-04-03 | Discharge: 2023-04-03 | Disposition: A | Discharge status: Home / Self Care | Payer: Medicare HMO | Source: Ambulatory Visit | Attending: Student | Admitting: Student

## 2023-04-03 ENCOUNTER — Other Ambulatory Visit: Payer: Self-pay | Admitting: *Deleted

## 2023-04-03 DIAGNOSIS — Z79899 Other long term (current) drug therapy: Secondary | ICD-10-CM | POA: Insufficient documentation

## 2023-04-03 LAB — LIPID PANEL
Cholesterol: 138 mg/dL (ref 0–200)
HDL: 45 mg/dL (ref >40)
LDL Cholesterol: 80 mg/dL (ref 0–99)
Total CHOL/HDL Ratio: 3.1 RATIO
Triglycerides: 66 mg/dL (ref <150)
VLDL: 13 mg/dL (ref 0–40)

## 2023-04-03 LAB — COMPREHENSIVE METABOLIC PANEL
ALT: 29 U/L (ref 0–44)
AST: 29 U/L (ref 15–41)
Albumin: 4 g/dL (ref 3.5–5.0)
Alkaline Phosphatase: 80 U/L (ref 38–126)
Anion gap: 8 (ref 5–15)
BUN: 22 mg/dL (ref 8–23)
CO2: 20 mmol/L — ABNORMAL LOW (ref 22–32)
Calcium: 8.6 mg/dL — ABNORMAL LOW (ref 8.9–10.3)
Chloride: 106 mmol/L (ref 98–111)
Creatinine, Ser: 1.2 mg/dL (ref 0.61–1.24)
GFR, Estimated: >60 mL/min (ref >60)
Glucose, Bld: 101 mg/dL — ABNORMAL HIGH (ref 70–99)
Potassium: 3.9 mmol/L (ref 3.5–5.1)
Sodium: 134 mmol/L — ABNORMAL LOW (ref 135–145)
Total Bilirubin: 1.1 mg/dL (ref 0.3–1.2)
Total Protein: 7.8 g/dL (ref 6.5–8.1)

## 2023-04-03 LAB — CBC
HCT: 42.1 % (ref 39.0–52.0)
Hemoglobin: 13.5 g/dL (ref 13.0–17.0)
MCH: 24.8 pg — ABNORMAL LOW (ref 26.0–34.0)
MCHC: 32.1 g/dL (ref 30.0–36.0)
MCV: 77.2 fL — ABNORMAL LOW (ref 80.0–100.0)
Platelets: 181 10*3/uL (ref 150–400)
RBC: 5.45 MIL/uL (ref 4.22–5.81)
RDW: 15.9 % — ABNORMAL HIGH (ref 11.5–15.5)
WBC: 3.5 10*3/uL — ABNORMAL LOW (ref 4.0–10.5)
nRBC: 0 % (ref 0.0–0.2)

## 2023-04-03 MED ORDER — WARFARIN SODIUM 5 MG PO TABS: ORAL_TABLET | ORAL | 1 refills | Status: DC

## 2023-04-26 ENCOUNTER — Ambulatory Visit: Payer: Medicare HMO | Attending: Cardiology | Admitting: *Deleted

## 2023-04-26 DIAGNOSIS — Z952 Presence of prosthetic heart valve: Secondary | ICD-10-CM

## 2023-04-26 DIAGNOSIS — Z5181 Encounter for therapeutic drug level monitoring: Secondary | ICD-10-CM

## 2023-04-26 LAB — POCT INR: INR: 1.5 — AB (ref 2.0–3.0)

## 2023-04-26 NOTE — Patient Instructions (Signed)
Take warfarin 3 tablets tonight and tomorrow night then continue 2 tablets daily Recheck in 2 wks per pt request  Keep using pill box for warfarin

## 2023-06-04 ENCOUNTER — Ambulatory Visit: Payer: 59 | Attending: Cardiology | Admitting: *Deleted

## 2023-06-04 DIAGNOSIS — Z5181 Encounter for therapeutic drug level monitoring: Secondary | ICD-10-CM | POA: Diagnosis not present

## 2023-06-04 DIAGNOSIS — Z952 Presence of prosthetic heart valve: Secondary | ICD-10-CM | POA: Diagnosis not present

## 2023-06-04 LAB — POCT INR: INR: 1.9 — AB (ref 2.0–3.0)

## 2023-06-04 NOTE — Patient Instructions (Signed)
Increase warfarin to 2 tablets daily except 3 tablets on Mondays and Thursdays Recheck in 2 wks  Keep using pill box for warfarin

## 2023-06-18 ENCOUNTER — Ambulatory Visit: Payer: 59 | Attending: Cardiology | Admitting: *Deleted

## 2023-06-18 DIAGNOSIS — Z5181 Encounter for therapeutic drug level monitoring: Secondary | ICD-10-CM | POA: Diagnosis not present

## 2023-06-18 DIAGNOSIS — Z952 Presence of prosthetic heart valve: Secondary | ICD-10-CM

## 2023-06-18 LAB — POCT INR: INR: 3.1 — AB (ref 2.0–3.0)

## 2023-06-18 NOTE — Patient Instructions (Signed)
Continue warfarin 2 tablets daily except 3 tablets on Mondays and Thursdays Recheck in 4 wks  Keep using pill box for warfarin

## 2023-07-16 ENCOUNTER — Ambulatory Visit: Payer: 59 | Attending: Cardiology | Admitting: *Deleted

## 2023-07-16 DIAGNOSIS — Z952 Presence of prosthetic heart valve: Secondary | ICD-10-CM

## 2023-07-16 DIAGNOSIS — Z5181 Encounter for therapeutic drug level monitoring: Secondary | ICD-10-CM

## 2023-07-16 LAB — POCT INR: INR: 3.6 — AB (ref 2.0–3.0)

## 2023-07-16 NOTE — Patient Instructions (Signed)
Take warfarin 2 tablets tonight then resume 2 tablets daily except 3 tablets on Mondays and Thursdays Recheck in 4 wks  Keep using pill box for warfarin

## 2023-08-13 ENCOUNTER — Ambulatory Visit: Payer: 59 | Attending: Cardiology | Admitting: *Deleted

## 2023-08-13 DIAGNOSIS — Z952 Presence of prosthetic heart valve: Secondary | ICD-10-CM

## 2023-08-13 DIAGNOSIS — Z5181 Encounter for therapeutic drug level monitoring: Secondary | ICD-10-CM | POA: Diagnosis not present

## 2023-08-13 LAB — POCT INR: INR: 3.7 — AB (ref 2.0–3.0)

## 2023-08-13 NOTE — Patient Instructions (Signed)
Decrease warfarin to 2 tablets daily except 3 tablets on Thursdays Recheck in 3 wks Keep using pill box for warfarin

## 2023-09-03 ENCOUNTER — Ambulatory Visit: Payer: 59 | Attending: Cardiology | Admitting: *Deleted

## 2023-09-03 DIAGNOSIS — Z952 Presence of prosthetic heart valve: Secondary | ICD-10-CM

## 2023-09-03 DIAGNOSIS — Z5181 Encounter for therapeutic drug level monitoring: Secondary | ICD-10-CM

## 2023-09-03 LAB — POCT INR: INR: 1.5 — AB (ref 2.0–3.0)

## 2023-09-03 NOTE — Patient Instructions (Signed)
Take warfarin 3 tablets today, 2 1/22 tablets tomorrow then resume 2 tablets daily except 3 tablets on Thursdays Recheck in 2 wks Keep using pill box for warfarin

## 2023-09-17 ENCOUNTER — Ambulatory Visit: Payer: 59 | Attending: Cardiology

## 2023-09-28 ENCOUNTER — Other Ambulatory Visit: Payer: Self-pay | Admitting: Cardiology

## 2023-10-10 ENCOUNTER — Other Ambulatory Visit: Payer: Self-pay | Admitting: Cardiology

## 2023-10-10 ENCOUNTER — Ambulatory Visit: Payer: 59 | Attending: Cardiology

## 2023-10-10 DIAGNOSIS — Z952 Presence of prosthetic heart valve: Secondary | ICD-10-CM

## 2023-10-10 DIAGNOSIS — Z5181 Encounter for therapeutic drug level monitoring: Secondary | ICD-10-CM

## 2023-10-10 LAB — POCT INR: INR: 3.4 — AB (ref 2.0–3.0)

## 2023-10-10 NOTE — Patient Instructions (Signed)
Description   Continue on same dosage of Warfarin 2 tablets daily except 3 tablets on Thursdays Recheck in 4 wks Keep using pill box for warfarin

## 2023-10-25 ENCOUNTER — Other Ambulatory Visit: Payer: Self-pay | Admitting: Cardiology

## 2023-10-25 ENCOUNTER — Telehealth: Payer: Self-pay | Admitting: Cardiology

## 2023-10-25 NOTE — Telephone Encounter (Signed)
Pt c/o medication issue:  1. Name of Medication: warfarin (COUMADIN) 5 MG tablet   2. How are you currently taking this medication (dosage and times per day)? As prescribed   3. Are you having a reaction (difficulty breathing--STAT)?   4. What is your medication issue? Patient's pharmacy is calling to inform that they are having to switch manufacturers and patient may need extra monitoring.

## 2023-10-25 NOTE — Telephone Encounter (Signed)
OK to change manufacturer .  Has INR appt next week.

## 2023-10-26 ENCOUNTER — Telehealth: Payer: Self-pay | Admitting: Cardiology

## 2023-10-26 MED ORDER — ENTRESTO 97-103 MG PO TABS: 1.0000 | ORAL_TABLET | Freq: Two times a day (BID) | ORAL | 3 refills | Status: AC

## 2023-10-26 NOTE — Telephone Encounter (Signed)
Spoke to [place] questioning pt's entresto dose. Sent in correct prescription to [facility].

## 2023-10-26 NOTE — Telephone Encounter (Signed)
Pt c/o medication issue:  1. Name of Medication:   sacubitril-valsartan (ENTRESTO) 97-103 MG    2. How are you currently taking this medication (dosage and times per day)? d  3. Are you having a reaction (difficulty breathing--STAT)? no  4. What is your medication issue? Calling in about the dosage of this medication. Please advise

## 2023-11-07 ENCOUNTER — Ambulatory Visit: Payer: 59 | Attending: Cardiology | Admitting: *Deleted

## 2023-11-07 DIAGNOSIS — Z952 Presence of prosthetic heart valve: Secondary | ICD-10-CM

## 2023-11-07 DIAGNOSIS — Z5181 Encounter for therapeutic drug level monitoring: Secondary | ICD-10-CM

## 2023-11-07 LAB — POCT INR: INR: 4.1 — AB (ref 2.0–3.0)

## 2023-11-07 NOTE — Patient Instructions (Signed)
Hold warfarin today then resume 2 tablets daily except 3 tablets on Thursdays Recheck in 4 wks Keep using pill box for warfarin

## 2023-12-05 ENCOUNTER — Ambulatory Visit: Payer: 59 | Attending: Cardiology

## 2024-01-13 ENCOUNTER — Other Ambulatory Visit: Payer: Self-pay | Admitting: Student

## 2024-01-28 ENCOUNTER — Other Ambulatory Visit: Payer: Self-pay | Admitting: Student

## 2024-01-30 NOTE — Telephone Encounter (Signed)
 This is a [place] pt.

## 2024-04-22 NOTE — Progress Notes (Deleted)
 Cardiology Office Note    Date:  04/22/2024  ID:  [name], DOB 09/02/1951, MRN [id number] Cardiologist: Gaylyn Keas, MD    History of Present Illness:    [name] is a 73 y.o. male with past medical history of severe AI (s/p St. Jude mechanical AVR in 2003 with Bentall procedure), CAD (s/p Coronary CT in 09/2022 showing moderate CAD but not significant by FFR), HTN, HLD and HFrEF (EF 45-50% in 06/2016, at 30-35% in 09/2021 and 01/2022) who presents to the office today for overdue follow-up.  He was last examined by myself in 03/2023 and denied any recent anginal symptoms at that time. He had only been taking some of his twice daily medications once daily and compliance was encouraged. He was continued on Atorvastatin  80 mg daily, Coreg  6.25 mg twice daily, Farxiga  10 mg daily, Zetia  10 mg daily, Entresto  97-103 mg twice daily, Spironolactone  25 mg daily and Coumadin . He had previously declined ICD placement. Was informed to follow-up in 6 months but has not been evaluated by Cardiology since. Appears most recent INR check was in 10/2023.  - INR CHECK! - CBC, CMET, FLP - Echo  ROS: ***  Studies Reviewed:   EKG: EKG is*** ordered today and demonstrates ***   EKG Interpretation Date/Time:    Ventricular Rate:    PR Interval:    QRS Duration:    QT Interval:    QTC Calculation:   R Axis:      Text Interpretation:         Limited Echocardiogram: 01/2022 IMPRESSIONS     1. Limited study.   2. Left ventricular ejection fraction, by estimation, is 30 to 35%. The  left ventricle has moderately decreased function. The left ventricular  internal cavity size was mildly dilated. There is severe concentric left  ventricular hypertrophy.   3. The mitral valve is abnormal, mildly thickened. Redundant and mobile  chordal structures in submitral apparatus noted without obvious leaflet  incompetence (no color Doppler performed).   4. There is a 25 mm St. Jude mechanical  valve present in the aortic  position.   5. The inferior vena cava is normal in size with greater than 50%  respiratory variability, suggesting right atrial pressure of 3 mmHg.   Comparison(s): Prior images reviewed side by side. No substantial change  in LVEF.   Coronary CTA: 09/2022 FINDINGS: Non-cardiac: See separate report from [facility]. No significant findings on limited lung and soft tissue windows.   Calcium  Score: 3 vessel calcium  noted   LM 0   LAD 532   LCX 269   RCA 86.2   Total 887 which is 95 th percentile for age/sex   Coronary Arteries: Left dominant with no anomalies   LM: Normal   LAD: 1-24% calcified plaque ostial and proximal, 25-49% mixed plaque in mid vessel 25-49% mixed plaque in distal vessel   D1: Normal   D2: 1-24% calcified plaque ostium   D3: Normal   Circumflex: 1-24% calcified plaque proximal 25-49% mixed plaque mid/distal   OM1: 1-24% calcified plaque in mid vessel   OM2: 25-49% mixed plaque   PLB: Heavily calcified 50-69% plaque at PDA take off   PDA 25-49% calcified plaque   RCA: Small non dominant 50-69% calcified plaque   Post Bental with 25 mm mechanical St Jude valve. No pannus or thrombus. Full motion not captured on prospective scan 35-75% phase so cannot comment   On opening angle Closing angle is normal at 30  degrees The aorta was not imaged past the distal graft suture line enough to measure but is dilated compared to the ascending aortic graft which measures 35 mm and is calcified   IMPRESSION: 1.  Calcium  score 887 which is 95 th percentile for age/sex   2. Post Bental with intact ascending graft Ascending aorta distal to graft appears dilated but not scanned high enough to evaluate   3. Normal appearing 25 mm mechanical St Jude bi leaflet valve with no thrombus / pannus and normal 30 degree closing angle   4. CAD RADS 3 coronary disease see description above study sent for FFR  CT  IMPRESSION: FFR CT negative for obstructive disease particularly in areas of concern in non dominant RCA and PLB/PDA ostium bifurcation    Risk Assessment/Calculations:   {Does this patient have ATRIAL FIBRILLATION?:[id number]} No BP recorded.  {Refresh Note OR Click here to enter BP  :1}***         Physical Exam:   VS:  There were no vitals taken for this visit.   Wt Readings from Last 3 Encounters:  03/21/23 214 lb (97.1 kg)  11/29/22 243 lb 9.6 oz (110.5 kg)  08/28/22 254 lb 3.2 oz (115.3 kg)     GEN: Well nourished, well developed in no acute distress NECK: No JVD; No carotid bruits CARDIAC: ***RRR, no murmurs, rubs, gallops RESPIRATORY:  Clear to auscultation without rales, wheezing or rhonchi  ABDOMEN: Appears non-distended. No obvious abdominal masses. EXTREMITIES: No clubbing or cyanosis. No edema.  Distal pedal pulses are 2+ bilaterally.   Assessment and Plan:   1. Chronic HFrEF -  His EF was at 30 to 35% by most recent imaging in 01/2022.  ***  2. Severe AI - He previously underwent s/p St. Jude mechanical AVR in 2003 with Bentall procedure. Normal in appearance by CT in 09/2022 with no thrombus or pannus.   3. CAD - Prior Coronary CT in 09/2022 showed moderate CAD but not significant by FFR. ***  4. HTN - BP is at *** during today's visit.   5. HLD - No recent FLP on file. LDL was at 80 when checked in 03/2023.   Signed, Dorma Gash, PA-C

## 2024-04-24 ENCOUNTER — Encounter: Payer: Self-pay | Admitting: Student

## 2024-04-24 ENCOUNTER — Ambulatory Visit: Attending: Student | Admitting: Student

## 2024-05-09 ENCOUNTER — Other Ambulatory Visit: Payer: Self-pay | Admitting: Student

## 2024-05-22 ENCOUNTER — Other Ambulatory Visit: Payer: Self-pay | Admitting: Student

## 2024-06-26 ENCOUNTER — Ambulatory Visit: Admitting: Student

## 2024-08-13 NOTE — Progress Notes (Deleted)
 Cardiology Office Note    Date:  08/13/2024  ID:  [name], DOB 1951-10-30, MRN [id number] Cardiologist: Wilbert Bihari, MD { :  History of Present Illness:    [name] is a 73 y.o. male with past medical history of severe AI (s/p St. Jude mechanical AVR in 2003 with Bentall procedure), CAD (s/p Coronary CT in 09/2022 showing moderate CAD but not significant by FFR), HTN, HLD and HFrEF (EF 45-50% in 06/2016, at 30-35% in 09/2021 and 01/2022) who presents to the office today for overdue follow-up.   He was last examined by myself in 03/2023 and denied any recent anginal symptoms at that time. He had only been taking some of his twice daily medications once daily and compliance was encouraged. He was continued on Atorvastatin  80 mg daily, Coreg  6.25 mg twice daily, Farxiga  10 mg daily, Zetia  10 mg daily, Entresto  97-103 mg twice daily, Spironolactone  25 mg daily and Coumadin . He had previously declined ICD placement. Was informed to follow-up in 6 months but has not been evaluated by Cardiology since. Appears most recent INR check was in 10/2023.   - INR CHECK! - CBC, CMET, FLP - Echo  ROS: ***  Studies Reviewed:   EKG: EKG is*** ordered today and demonstrates ***   EKG Interpretation Date/Time:    Ventricular Rate:    PR Interval:    QRS Duration:    QT Interval:    QTC Calculation:   R Axis:      Text Interpretation:         Limited Echocardiogram: 01/2022 IMPRESSIONS     1. Limited study.   2. Left ventricular ejection fraction, by estimation, is 30 to 35%. The  left ventricle has moderately decreased function. The left ventricular  internal cavity size was mildly dilated. There is severe concentric left  ventricular hypertrophy.   3. The mitral valve is abnormal, mildly thickened. Redundant and mobile  chordal structures in submitral apparatus noted without obvious leaflet  incompetence (no color Doppler performed).   4. There is a 25 mm St. Jude  mechanical valve present in the aortic  position.   5. The inferior vena cava is normal in size with greater than 50%  respiratory variability, suggesting right atrial pressure of 3 mmHg.   Comparison(s): Prior images reviewed side by side. No substantial change  in LVEF.    Coronary CTA: 09/2022 FINDINGS: Non-cardiac: See separate report from [facility]. No significant findings on limited lung and soft tissue windows.   Calcium  Score: 3 vessel calcium  noted   LM 0   LAD 532   LCX 269   RCA 86.2   Total 887 which is 95 th percentile for age/sex   Coronary Arteries: Left dominant with no anomalies   LM: Normal   LAD: 1-24% calcified plaque ostial and proximal, 25-49% mixed plaque in mid vessel 25-49% mixed plaque in distal vessel   D1: Normal   D2: 1-24% calcified plaque ostium   D3: Normal   Circumflex: 1-24% calcified plaque proximal 25-49% mixed plaque mid/distal   OM1: 1-24% calcified plaque in mid vessel   OM2: 25-49% mixed plaque   PLB: Heavily calcified 50-69% plaque at PDA take off   PDA 25-49% calcified plaque   RCA: Small non dominant 50-69% calcified plaque   Post Bental with 25 mm mechanical St Jude valve. No pannus or thrombus. Full motion not captured on prospective scan 35-75% phase so cannot comment   On opening angle Closing angle is  normal at 30 degrees The aorta was not imaged past the distal graft suture line enough to measure but is dilated compared to the ascending aortic graft which measures 35 mm and is calcified   IMPRESSION: 1.  Calcium  score 887 which is 95 th percentile for age/sex   2. Post Bental with intact ascending graft Ascending aorta distal to graft appears dilated but not scanned high enough to evaluate   3. Normal appearing 25 mm mechanical St Jude bi leaflet valve with no thrombus / pannus and normal 30 degree closing angle   4. CAD RADS 3 coronary disease see description above study sent for FFR  CT   IMPRESSION: FFR CT negative for obstructive disease particularly in areas of concern in non dominant RCA and PLB/PDA ostium bifurcation    Risk Assessment/Calculations:   {Does this patient have ATRIAL FIBRILLATION?[phone]} No BP recorded.  {Refresh Note OR Click here to enter BP  :1}***         Physical Exam:   VS:  There were no vitals taken for this visit.   Wt Readings from Last 3 Encounters:  03/21/23 214 lb (97.1 kg)  11/29/22 243 lb 9.6 oz (110.5 kg)  08/28/22 254 lb 3.2 oz (115.3 kg)     GEN: Well nourished, well developed in no acute distress NECK: No JVD; No carotid bruits CARDIAC: ***RRR, no murmurs, rubs, gallops RESPIRATORY:  Clear to auscultation without rales, wheezing or rhonchi  ABDOMEN: Appears non-distended. No obvious abdominal masses. EXTREMITIES: No clubbing or cyanosis. No edema.  Distal pedal pulses are 2+ bilaterally.   Assessment and Plan:   1. Chronic HFrEF -  His EF was at 30 to 35% by most recent imaging in 01/2022.  ***   2. Severe AI - He previously underwent s/p St. Jude mechanical AVR in 2003 with Bentall procedure. Normal in appearance by CT in 09/2022 with no thrombus or pannus.    3. CAD - Prior Coronary CT in 09/2022 showed moderate CAD but not significant by FFR. ***   4. HTN - BP is at *** during today's visit.    5. HLD - No recent FLP on file. LDL was at 80 when checked in 03/2023.    Signed, Laymon CHRISTELLA Qua, PA-C

## 2024-08-14 ENCOUNTER — Ambulatory Visit: Attending: Student | Admitting: Student

## 2024-11-20 DEATH — deceased
